# Patient Record
Sex: Female | Born: 1980 | Race: Black or African American | Hispanic: No | Marital: Single | Smoking: Former smoker
Health system: Southern US, Community
[De-identification: ages and names within clinical notes are randomized; demographics above are authoritative.]

## PROBLEM LIST (undated history)

## (undated) ENCOUNTER — Emergency Department (HOSPITAL_COMMUNITY): Admission: EM | Payer: No Typology Code available for payment source | Source: Home / Self Care

## (undated) DIAGNOSIS — IMO0002 Reserved for concepts with insufficient information to code with codable children: Secondary | ICD-10-CM

## (undated) DIAGNOSIS — M329 Systemic lupus erythematosus, unspecified: Secondary | ICD-10-CM

## (undated) HISTORY — PX: TUBAL LIGATION: SHX77

---
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
## 1999-01-01 ENCOUNTER — Encounter: Admission: RE | Admit: 1999-01-01 | Discharge: 1999-01-01 | Payer: Self-pay | Admitting: Family Medicine

## 1999-01-18 ENCOUNTER — Encounter: Admission: RE | Admit: 1999-01-18 | Discharge: 1999-01-18 | Payer: Self-pay | Admitting: Family Medicine

## 1999-01-25 ENCOUNTER — Encounter: Admission: RE | Admit: 1999-01-25 | Discharge: 1999-01-25 | Payer: Self-pay | Admitting: Family Medicine

## 1999-01-26 ENCOUNTER — Encounter: Admission: RE | Admit: 1999-01-26 | Discharge: 1999-01-26 | Payer: Self-pay | Admitting: Sports Medicine

## 1999-01-27 ENCOUNTER — Encounter: Admission: RE | Admit: 1999-01-27 | Discharge: 1999-01-27 | Payer: Self-pay | Admitting: Family Medicine

## 1999-03-31 ENCOUNTER — Emergency Department (HOSPITAL_COMMUNITY): Admission: EM | Admit: 1999-03-31 | Discharge: 1999-03-31 | Payer: Self-pay | Admitting: Emergency Medicine

## 1999-04-02 ENCOUNTER — Encounter: Admission: RE | Admit: 1999-04-02 | Discharge: 1999-04-02 | Payer: Self-pay | Admitting: Family Medicine

## 1999-04-06 ENCOUNTER — Encounter: Admission: RE | Admit: 1999-04-06 | Discharge: 1999-04-06 | Payer: Self-pay | Admitting: Family Medicine

## 1999-04-15 ENCOUNTER — Encounter: Admission: RE | Admit: 1999-04-15 | Discharge: 1999-04-15 | Payer: Self-pay | Admitting: Family Medicine

## 1999-04-23 ENCOUNTER — Encounter: Admission: RE | Admit: 1999-04-23 | Discharge: 1999-04-23 | Payer: Self-pay | Admitting: Family Medicine

## 1999-04-27 ENCOUNTER — Encounter: Admission: RE | Admit: 1999-04-27 | Discharge: 1999-04-27 | Payer: Self-pay | Admitting: Sports Medicine

## 1999-05-12 ENCOUNTER — Encounter: Admission: RE | Admit: 1999-05-12 | Discharge: 1999-05-12 | Payer: Self-pay | Admitting: Family Medicine

## 1999-07-28 ENCOUNTER — Encounter: Admission: RE | Admit: 1999-07-28 | Discharge: 1999-07-28 | Payer: Self-pay | Admitting: Family Medicine

## 1999-10-07 ENCOUNTER — Emergency Department (HOSPITAL_COMMUNITY): Admission: EM | Admit: 1999-10-07 | Discharge: 1999-10-07 | Payer: Self-pay | Admitting: Emergency Medicine

## 1999-11-23 ENCOUNTER — Encounter: Admission: RE | Admit: 1999-11-23 | Discharge: 1999-11-23 | Payer: Self-pay | Admitting: Family Medicine

## 2001-06-14 ENCOUNTER — Emergency Department (HOSPITAL_COMMUNITY): Admission: EM | Admit: 2001-06-14 | Discharge: 2001-06-14 | Payer: Self-pay | Admitting: Emergency Medicine

## 2001-06-14 ENCOUNTER — Encounter: Payer: Self-pay | Admitting: Emergency Medicine

## 2002-05-15 ENCOUNTER — Inpatient Hospital Stay (HOSPITAL_COMMUNITY): Admission: AD | Admit: 2002-05-15 | Discharge: 2002-05-15 | Payer: Self-pay | Admitting: Obstetrics and Gynecology

## 2002-05-19 ENCOUNTER — Inpatient Hospital Stay (HOSPITAL_COMMUNITY): Admission: AD | Admit: 2002-05-19 | Discharge: 2002-05-19 | Payer: Self-pay | Admitting: Obstetrics and Gynecology

## 2002-09-18 ENCOUNTER — Emergency Department (HOSPITAL_COMMUNITY): Admission: EM | Admit: 2002-09-18 | Discharge: 2002-09-18 | Payer: Self-pay | Admitting: Emergency Medicine

## 2003-12-30 ENCOUNTER — Inpatient Hospital Stay (HOSPITAL_COMMUNITY): Admission: AD | Admit: 2003-12-30 | Discharge: 2003-12-30 | Payer: Self-pay | Admitting: *Deleted

## 2004-01-01 ENCOUNTER — Inpatient Hospital Stay (HOSPITAL_COMMUNITY): Admission: AD | Admit: 2004-01-01 | Discharge: 2004-01-01 | Payer: Self-pay | Admitting: Obstetrics & Gynecology

## 2005-02-28 ENCOUNTER — Ambulatory Visit: Payer: Self-pay | Admitting: Internal Medicine

## 2005-03-31 ENCOUNTER — Ambulatory Visit: Payer: Self-pay | Admitting: Internal Medicine

## 2005-03-31 ENCOUNTER — Encounter: Payer: Self-pay | Admitting: Internal Medicine

## 2005-04-29 ENCOUNTER — Emergency Department (HOSPITAL_COMMUNITY): Admission: EM | Admit: 2005-04-29 | Discharge: 2005-04-29 | Payer: Self-pay | Admitting: Family Medicine

## 2005-05-03 ENCOUNTER — Inpatient Hospital Stay (HOSPITAL_COMMUNITY): Admission: AD | Admit: 2005-05-03 | Discharge: 2005-05-04 | Payer: Self-pay | Admitting: *Deleted

## 2005-05-11 ENCOUNTER — Inpatient Hospital Stay (HOSPITAL_COMMUNITY): Admission: RE | Admit: 2005-05-11 | Discharge: 2005-05-11 | Payer: Self-pay | Admitting: *Deleted

## 2005-07-13 ENCOUNTER — Inpatient Hospital Stay (HOSPITAL_COMMUNITY): Admission: AD | Admit: 2005-07-13 | Discharge: 2005-07-13 | Payer: Self-pay | Admitting: Obstetrics & Gynecology

## 2005-07-21 ENCOUNTER — Ambulatory Visit: Payer: Self-pay | Admitting: *Deleted

## 2005-07-22 ENCOUNTER — Inpatient Hospital Stay (HOSPITAL_COMMUNITY): Admission: AD | Admit: 2005-07-22 | Discharge: 2005-07-24 | Payer: Self-pay | Admitting: *Deleted

## 2005-07-22 ENCOUNTER — Ambulatory Visit: Payer: Self-pay | Admitting: Family Medicine

## 2005-07-22 ENCOUNTER — Ambulatory Visit: Payer: Self-pay | Admitting: *Deleted

## 2005-07-28 ENCOUNTER — Ambulatory Visit: Payer: Self-pay | Admitting: Gynecology

## 2005-08-11 ENCOUNTER — Ambulatory Visit (HOSPITAL_COMMUNITY): Admission: RE | Admit: 2005-08-11 | Discharge: 2005-08-11 | Payer: Self-pay | Admitting: *Deleted

## 2005-08-25 ENCOUNTER — Ambulatory Visit: Payer: Self-pay | Admitting: Gynecology

## 2005-09-15 ENCOUNTER — Ambulatory Visit (HOSPITAL_COMMUNITY): Admission: RE | Admit: 2005-09-15 | Discharge: 2005-09-15 | Payer: Self-pay | Admitting: Obstetrics & Gynecology

## 2005-09-15 ENCOUNTER — Ambulatory Visit: Payer: Self-pay | Admitting: Gynecology

## 2005-10-06 ENCOUNTER — Ambulatory Visit: Payer: Self-pay | Admitting: Gynecology

## 2005-10-20 ENCOUNTER — Ambulatory Visit: Payer: Self-pay | Admitting: Gynecology

## 2005-10-21 ENCOUNTER — Ambulatory Visit: Payer: Self-pay | Admitting: Obstetrics & Gynecology

## 2005-10-24 ENCOUNTER — Ambulatory Visit: Payer: Self-pay | Admitting: Gynecology

## 2005-10-27 ENCOUNTER — Inpatient Hospital Stay (HOSPITAL_COMMUNITY): Admission: AD | Admit: 2005-10-27 | Discharge: 2005-11-01 | Payer: Self-pay | Admitting: Gynecology

## 2005-10-27 ENCOUNTER — Ambulatory Visit: Payer: Self-pay | Admitting: Obstetrics and Gynecology

## 2005-10-27 ENCOUNTER — Ambulatory Visit: Payer: Self-pay | Admitting: Neonatology

## 2005-10-29 ENCOUNTER — Encounter (INDEPENDENT_AMBULATORY_CARE_PROVIDER_SITE_OTHER): Payer: Self-pay | Admitting: Specialist

## 2005-11-05 ENCOUNTER — Ambulatory Visit: Payer: Self-pay | Admitting: Certified Nurse Midwife

## 2005-11-05 ENCOUNTER — Inpatient Hospital Stay (HOSPITAL_COMMUNITY): Admission: AD | Admit: 2005-11-05 | Discharge: 2005-11-05 | Payer: Self-pay | Admitting: Gynecology

## 2006-06-02 ENCOUNTER — Encounter (INDEPENDENT_AMBULATORY_CARE_PROVIDER_SITE_OTHER): Payer: Self-pay | Admitting: *Deleted

## 2006-08-13 ENCOUNTER — Inpatient Hospital Stay (HOSPITAL_COMMUNITY): Admission: AD | Admit: 2006-08-13 | Discharge: 2006-08-13 | Payer: Self-pay | Admitting: Obstetrics

## 2006-08-24 ENCOUNTER — Ambulatory Visit (HOSPITAL_COMMUNITY): Admission: RE | Admit: 2006-08-24 | Discharge: 2006-08-24 | Payer: Self-pay | Admitting: Obstetrics

## 2006-10-31 ENCOUNTER — Ambulatory Visit (HOSPITAL_COMMUNITY): Admission: RE | Admit: 2006-10-31 | Discharge: 2006-10-31 | Payer: Self-pay | Admitting: Obstetrics

## 2006-11-27 ENCOUNTER — Inpatient Hospital Stay (HOSPITAL_COMMUNITY): Admission: AD | Admit: 2006-11-27 | Discharge: 2006-12-01 | Payer: Self-pay | Admitting: Obstetrics & Gynecology

## 2007-02-12 ENCOUNTER — Inpatient Hospital Stay (HOSPITAL_COMMUNITY): Admission: AD | Admit: 2007-02-12 | Discharge: 2007-02-15 | Payer: Self-pay | Admitting: Obstetrics

## 2007-03-09 ENCOUNTER — Inpatient Hospital Stay (HOSPITAL_COMMUNITY): Admission: AD | Admit: 2007-03-09 | Discharge: 2007-03-09 | Payer: Self-pay | Admitting: Obstetrics & Gynecology

## 2007-03-16 ENCOUNTER — Inpatient Hospital Stay (HOSPITAL_COMMUNITY): Admission: AD | Admit: 2007-03-16 | Discharge: 2007-03-16 | Payer: Self-pay | Admitting: Obstetrics

## 2007-03-22 ENCOUNTER — Encounter: Payer: Self-pay | Admitting: Obstetrics

## 2007-03-22 ENCOUNTER — Inpatient Hospital Stay (HOSPITAL_COMMUNITY): Admission: RE | Admit: 2007-03-22 | Discharge: 2007-03-25 | Payer: Self-pay | Admitting: Obstetrics

## 2007-05-02 ENCOUNTER — Emergency Department (HOSPITAL_COMMUNITY): Admission: EM | Admit: 2007-05-02 | Discharge: 2007-05-02 | Payer: Self-pay | Admitting: Family Medicine

## 2008-04-24 IMAGING — US US OB FOLLOW-UP
1 series · 13 of 28 positions shown · non-contrast
Comparison: none

CLINICAL DATA: Follow-up fetal anatomy and assess growth.

[Series 1: us ob follow-up · 0.35mm/px · 13 of 65 slices shown]
[im 3/65]
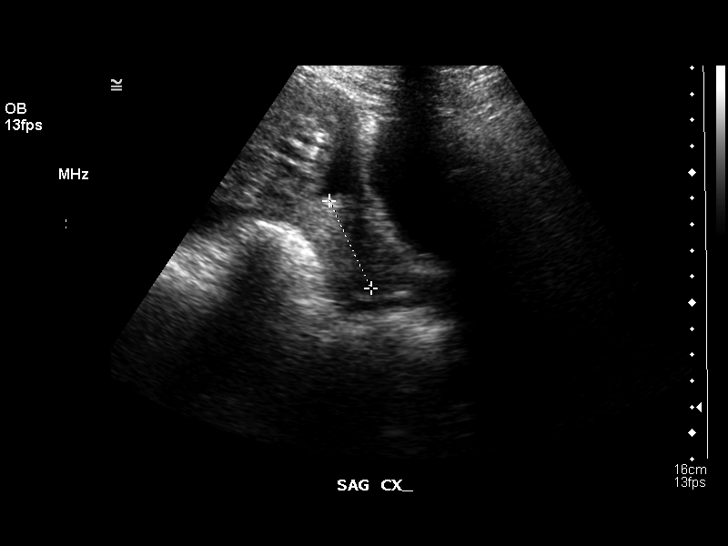
[im 8/65]
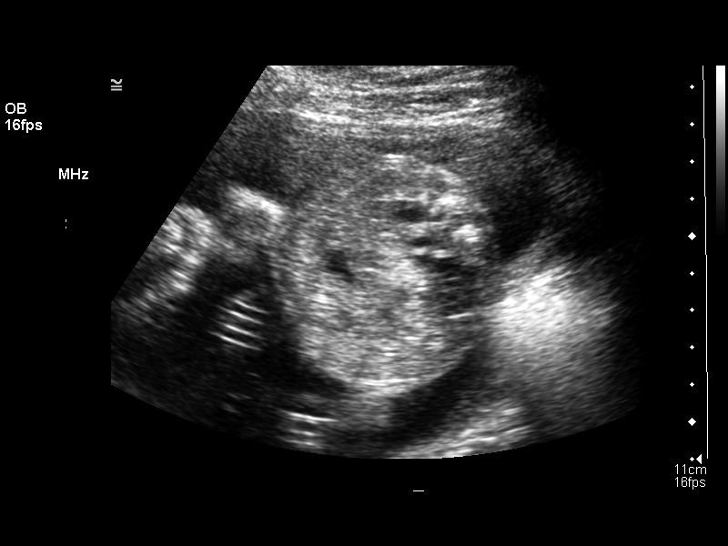
[im 12/65]
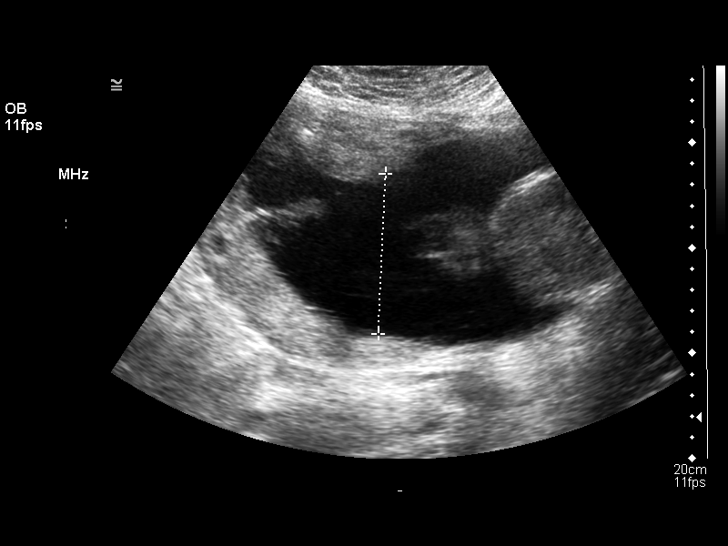
[im 17/65]
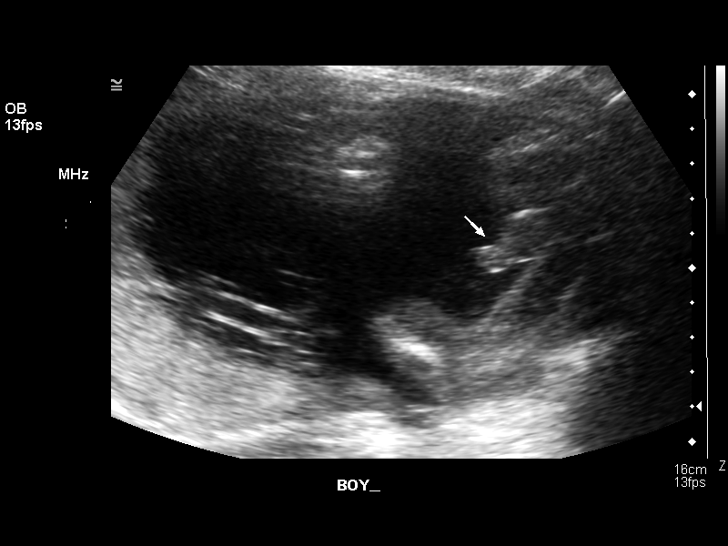
[im 22/65]
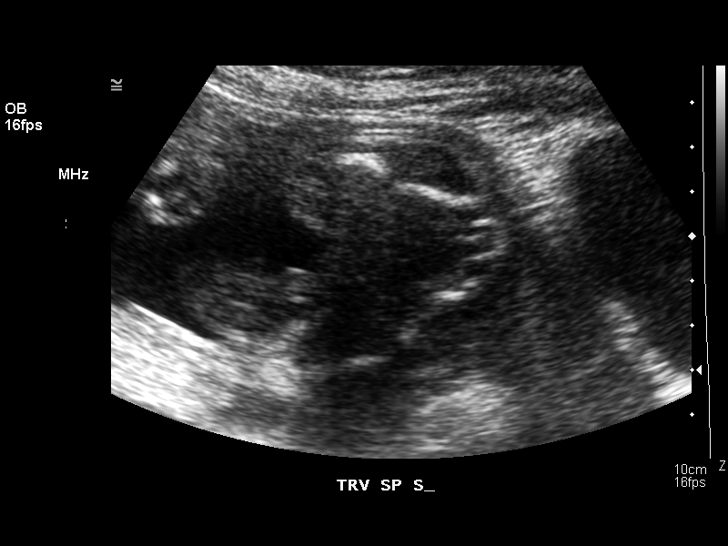
[im 27/65]
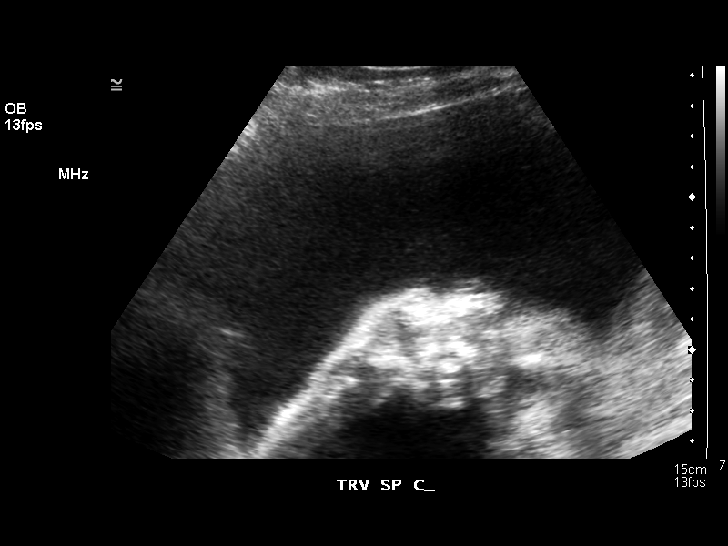
[im 34/65]
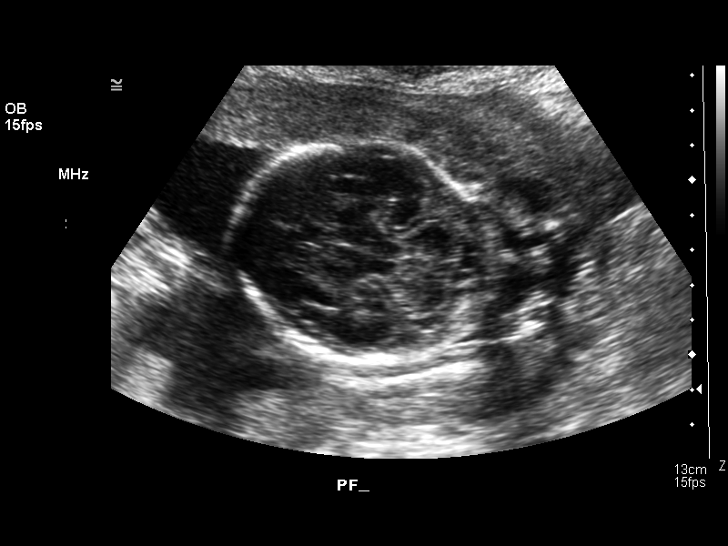
[im 38/65]
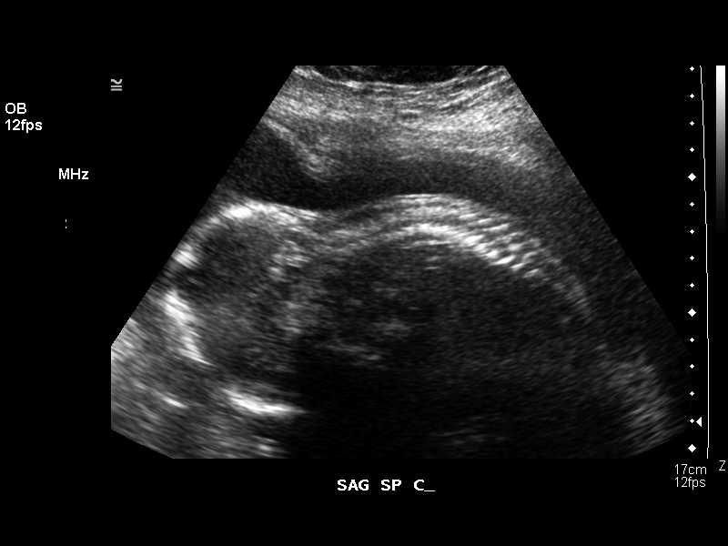
[im 43/65]
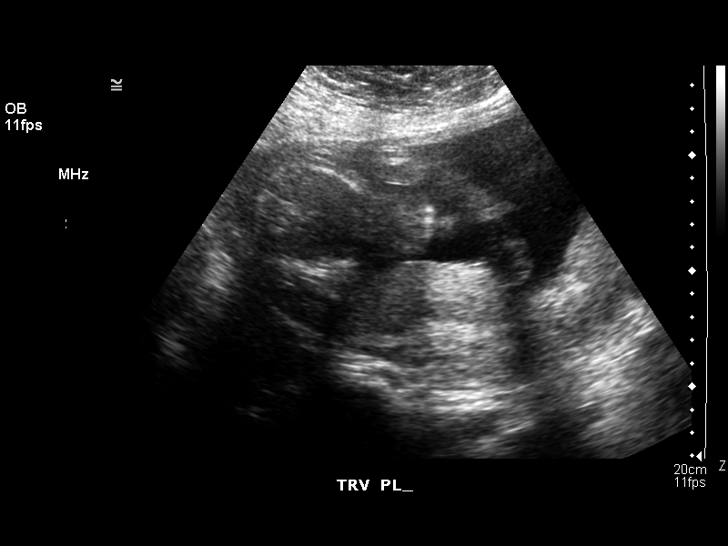
[im 48/65]
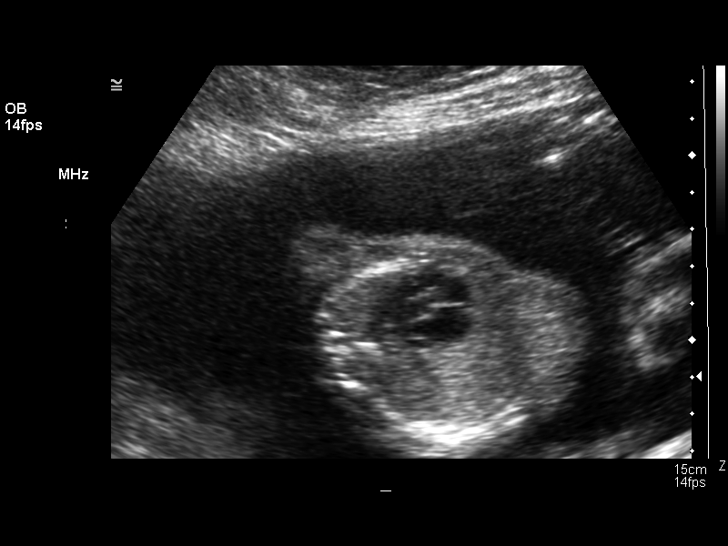
[im 53/65]
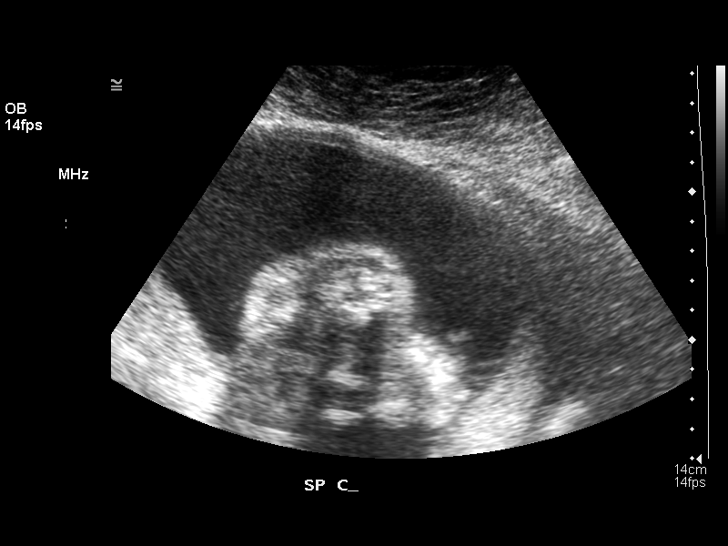
[im 57/65]
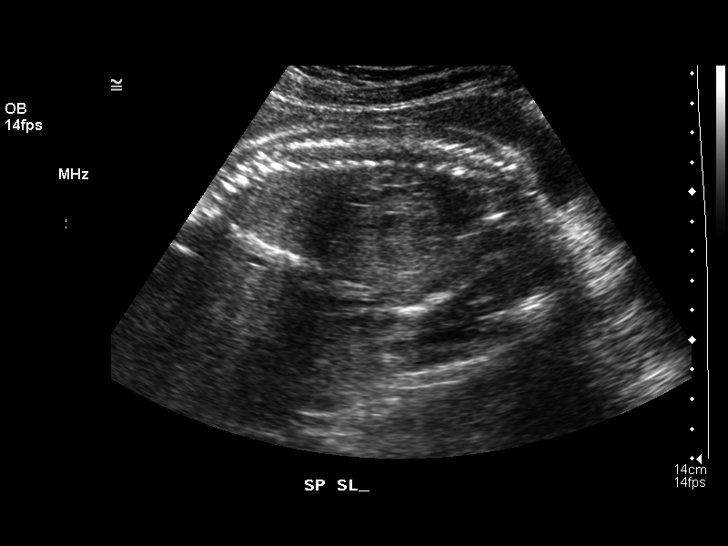
[im 62/65]
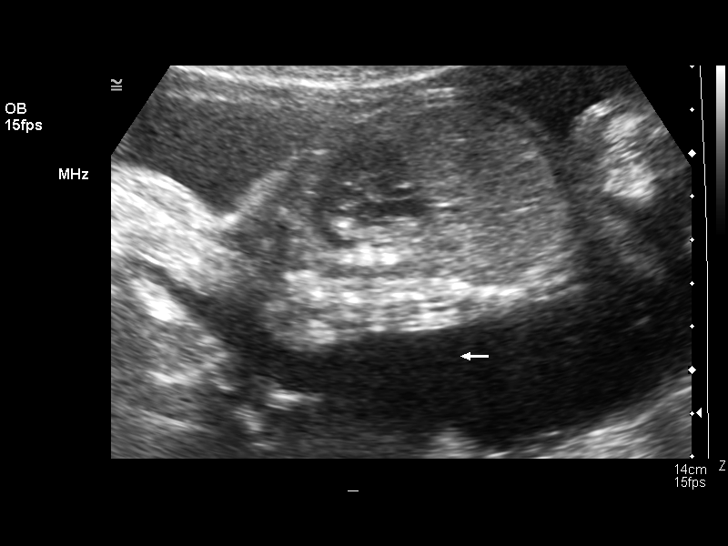

[13 of 28 positions shown; findings below may reference images not displayed]

OBSTETRICAL ULTRASOUND RE-EVALUATION:
 Number of Fetuses: 1
 Heart Rate:  141
 Movement:  Yes
 Breathing:  No
 Presentation:  Breech
 Placental Location:  Posterior
 Grade:  I
 Previa:  No
 Amniotic Fluid (subjective):  Normal
 Amniotic Fluid (objective):  7.6 cm Vertical pocket 

 FETAL BIOMETRY
 BPD:  6.3 cm  25 w 4 d
 HC:  22.0 cm   24 w 6 d
 AC:  19.6 cm   24 w 2 d
 FL:   4.4 cm   24 w 4 d 

 Mean GA:  24 w 6 d  US EDC:  12/30/05
 Assigned GA:  24 w 6 d  Assigned EDC:  12/30/05
 Fetal indices are within normal limits.  
 EFW: 701 g (H)

 FETAL ANATOMY
 Lateral Ventricles:  Visualized 
 Thalami/CSP:  Visualized 
 Posterior Fossa:  Visualized 
 Nuchal Region:  Previously seen 
 Spine:  Visualized 
 4 Chamber Heart on Left:  Visualized 
 Stomach on Left:  Visualized 
 3 Vessel Cord:  Previously seen 
 Cord Insertion Site:  Visualized 
 Kidneys:  Visualized 
 Bladder:  Visualized 
 Extremities:  Previously seen 

 ADDITIONAL ANATOMY VISUALIZED:  RVOT, diaphragm, heel, 5th digit, ductal arch, and aortic arch. 
 Comments:  Noted is the presence of mild bilateral renal fetal pyelectasis today measuring 5.5 mm on the left and 3.8 mm on the right.  Follow-up at greater than 33 weeks would be recommended for reassessment.  A nasal bone could not be clearly identified on profile views.

 Evaluation limited by: Fetal position.

 MATERNAL UTERINE AND ADNEXAL FINDINGS
 Cervix: 3.7 cm Transabdominally
IMPRESSION: 1.  Single intrauterine pregnancy demonstrating an estimated gestational age by ultrasound of 24 weeks and 6 days.  Correlation with assigned gestational age by initial ultrasound of 24 weeks and 6 days correlates with appropriate growth.
 2.  An improved anatomic assessment was possible today.  The fetal spine, RVOT, 5th digit, and cardiac arches were all seen and have a normal appearance.  On today’s exam review of the fetal profile did not definitely delineate the nasal bone.  Review of images from 08/11/05 do not clearly demonstrate visualization of a nasal bone on profile images suggesting that the nasal bone is absent.  This carries an increased risk for Down syndrome of 8x the patient’s baseline risk.  Also noted is the presence of mild bilateral renal fetal pyelectasis.  This was not present on the previous exam with the right kidney demonstrating an AP width of 5.5 mm.  Follow-up for this finding at greater than 33 weeks would be recommended for reassessment.  
 3.  Subjectively and quantitatively normal amniotic fluid volume and normal cervical length.  
 4.  The patient was sent with a preliminary copy of today’s results to an office visit immediately following this exam.

## 2009-08-20 ENCOUNTER — Emergency Department (HOSPITAL_COMMUNITY): Admission: EM | Admit: 2009-08-20 | Discharge: 2009-08-20 | Payer: Self-pay | Admitting: Family Medicine

## 2009-09-22 IMAGING — US US RENAL
1 series · 14 of 25 positions shown · non-contrast
Comparison: none

CLINICAL DATA: Pyelonephritis.  
 RENAL/URINARY TRACT ULTRASOUND:
TECHNIQUE: Complete ultrasound examination of the urinary tract was performed including evaluation of the kidneys, renal collecting systems, and urinary bladder.

[Series 1: us renal · 0.30mm/px · 14 of 34 slices shown]
[im 1/34]
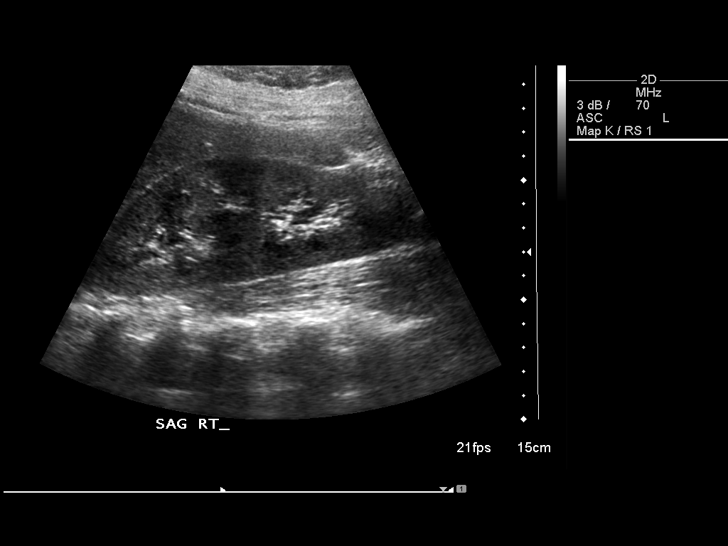
[im 3/34]
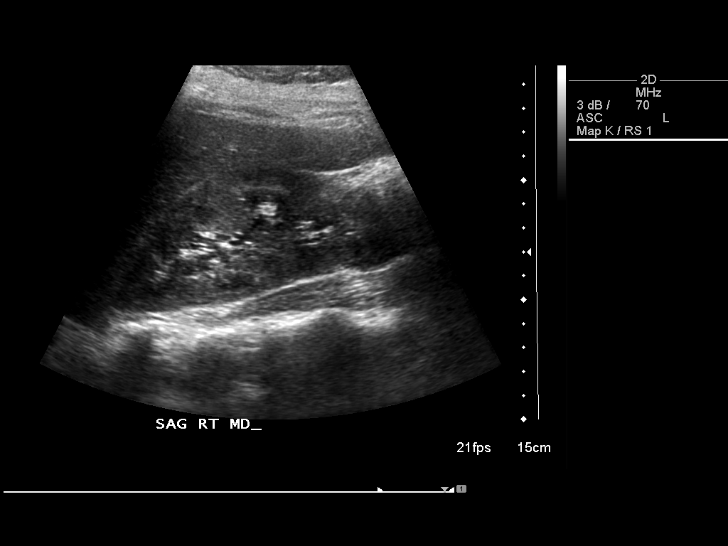
[im 6/34]
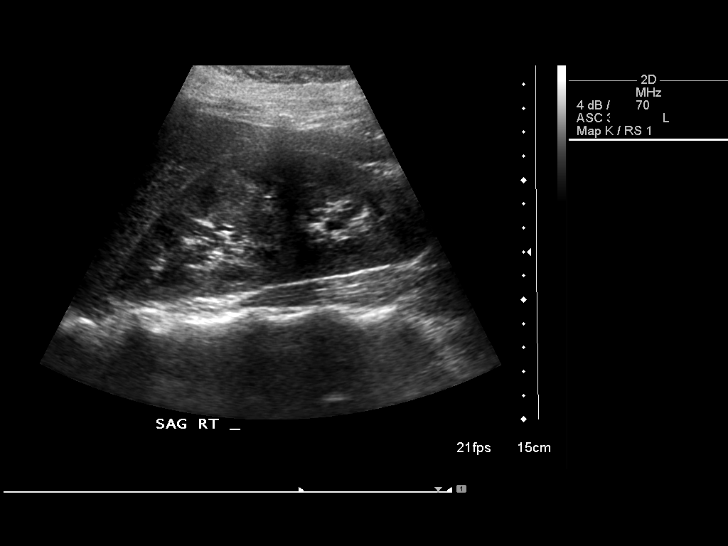
[im 9/34]
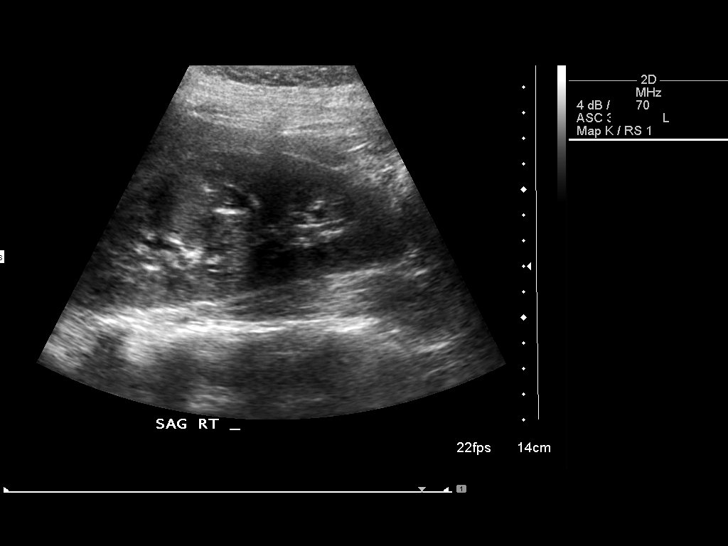
[im 12/34]
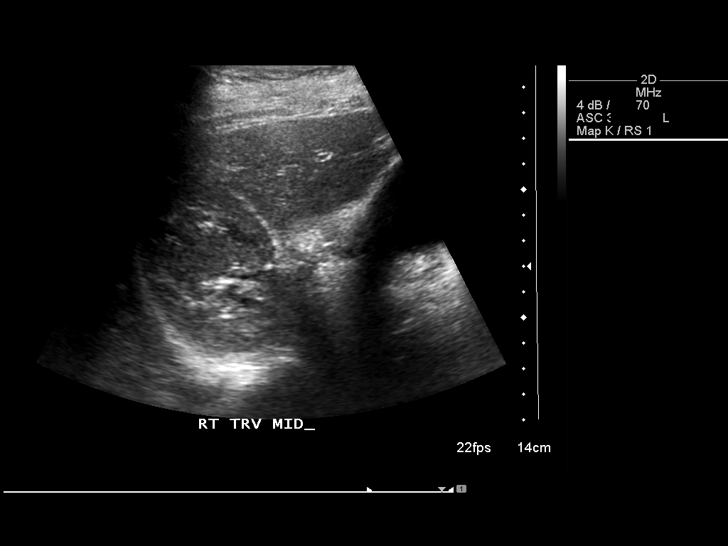
[im 13/34]
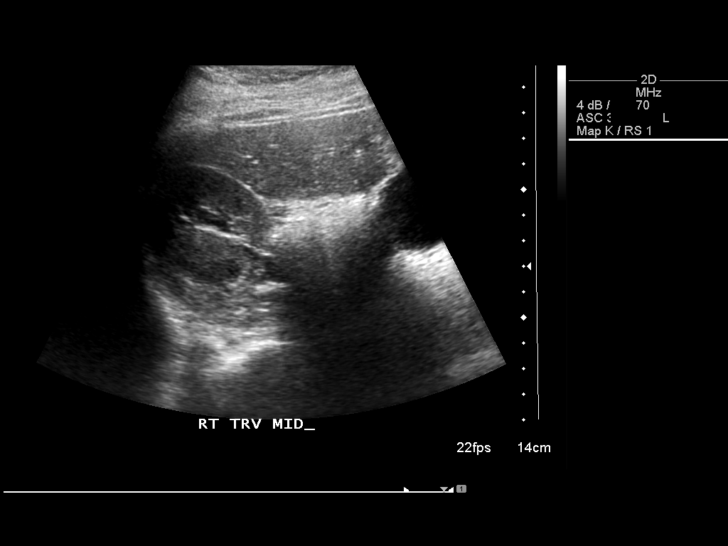
[im 16/34]
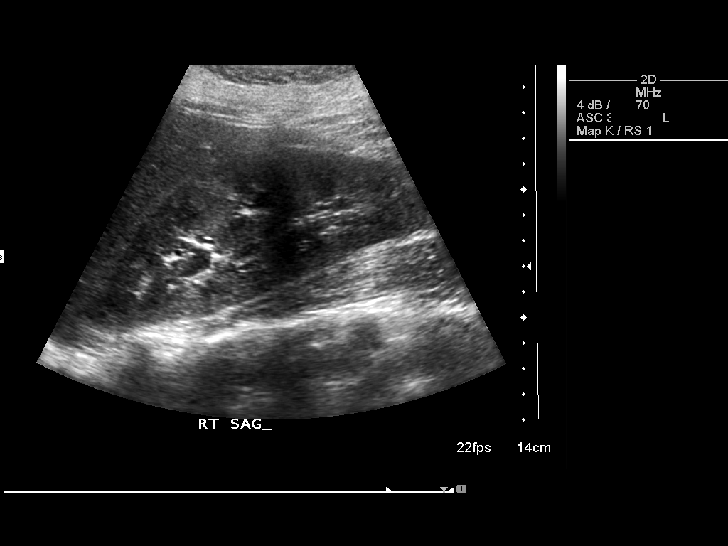
[im 18/34]
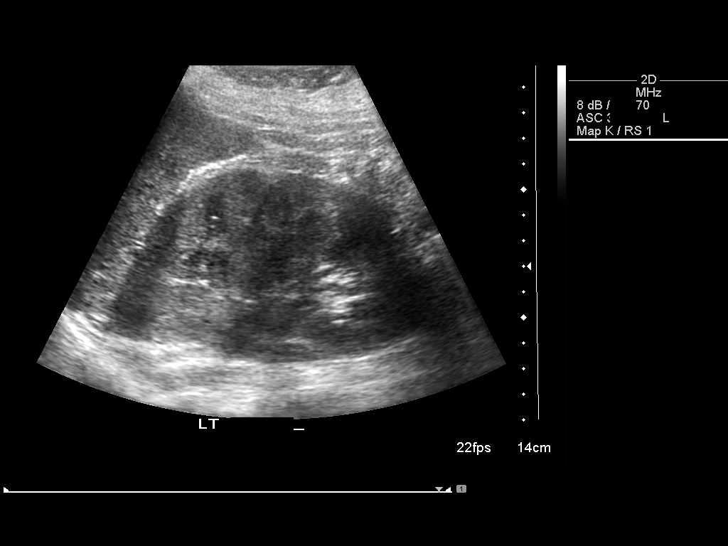
[im 21/34]
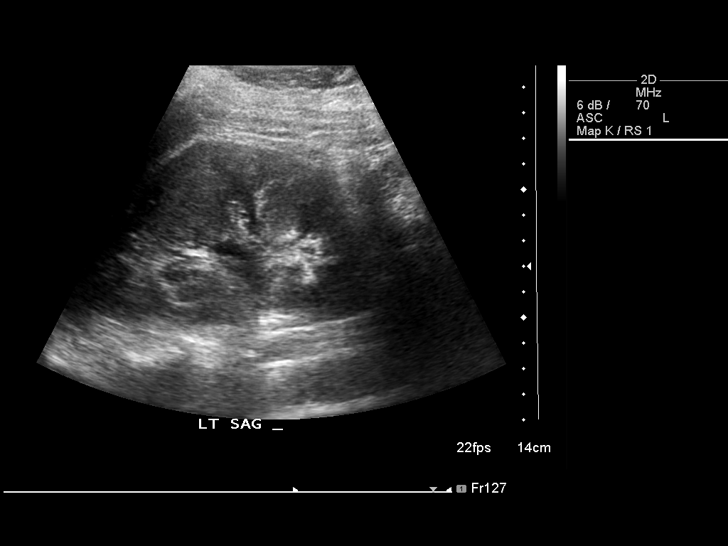
[im 23/34]
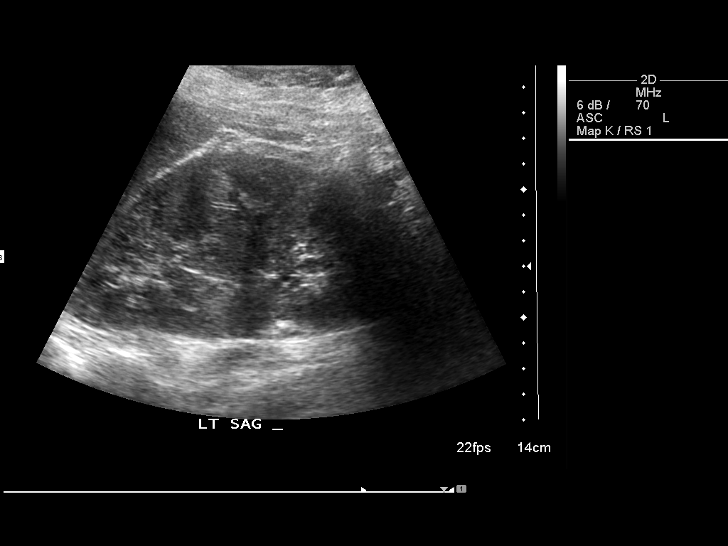
[im 25/34]
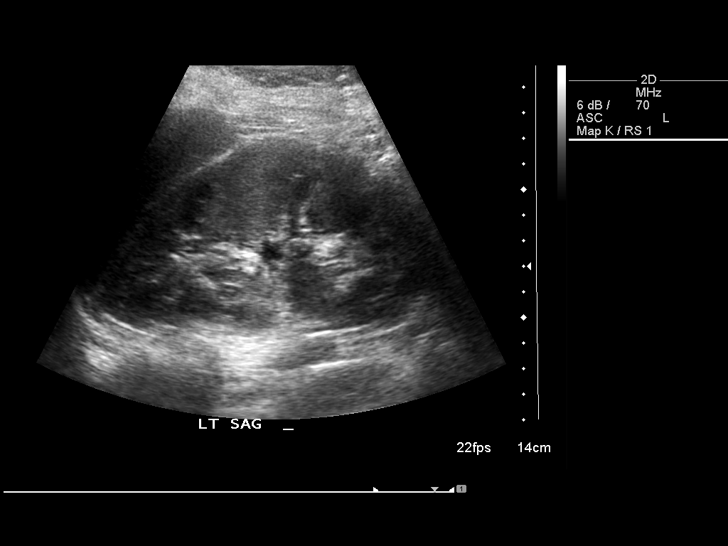
[im 28/34]
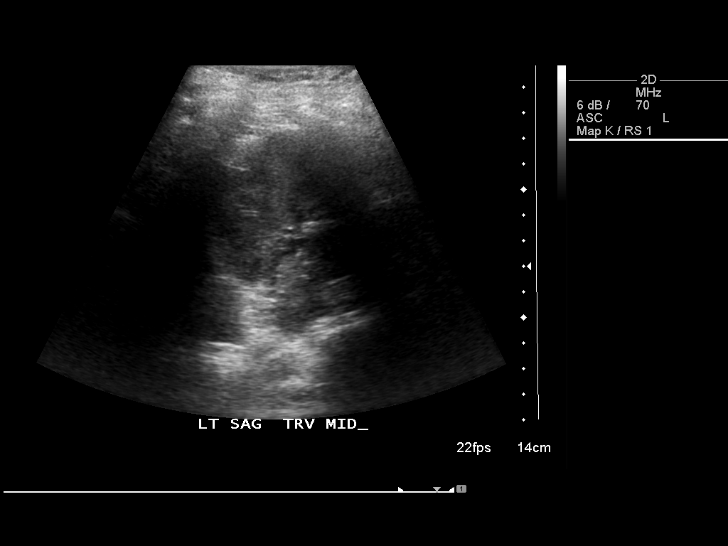
[im 31/34]
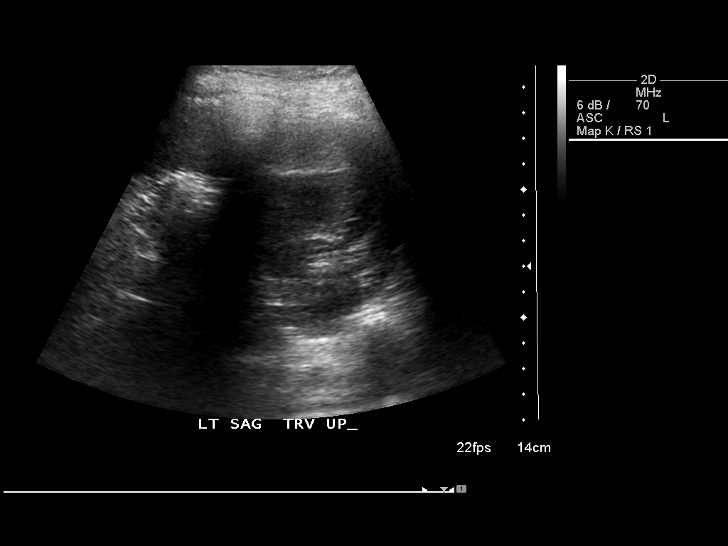
[im 34/34]
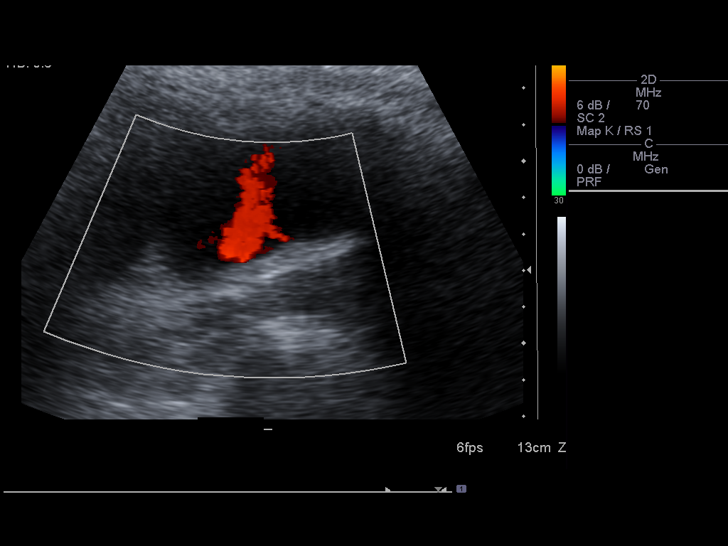

[14 of 25 positions shown; findings below may reference images not displayed]

FINDINGS: Both kidneys have a normal size, shape, and echotexture.  The right kidney measures 14.2 cm and the left kidney measures 14.1 cm in length.  No hydronephrosis, shadowing renal calculi, or masses are identified.  The corticomedullary echotexture is symmetric and normal bilaterally.  Bilateral ureteral jets are noted.
IMPRESSION: Normal renal ultrasound.

## 2010-04-25 ENCOUNTER — Encounter: Payer: Self-pay | Admitting: *Deleted

## 2010-06-21 LAB — URINE CULTURE
Colony Count: NO GROWTH
Culture: NO GROWTH

## 2010-06-21 LAB — POCT URINALYSIS DIP (DEVICE)
Bilirubin Urine: NEGATIVE
Glucose, UA: NEGATIVE mg/dL
Ketones, ur: NEGATIVE mg/dL
Nitrite: POSITIVE — AB
Protein, ur: NEGATIVE mg/dL
Specific Gravity, Urine: 1.02 (ref 1.005–1.030)
Urobilinogen, UA: 0.2 mg/dL (ref 0.0–1.0)
pH: 6.5 (ref 5.0–8.0)

## 2010-06-21 LAB — POCT PREGNANCY, URINE: Preg Test, Ur: NEGATIVE

## 2010-08-17 NOTE — Op Note (Signed)
[name], [name]             ACCOUNT NO.:  [id number]   MEDICAL RECORD NO.:  [id number]          PATIENT TYPE:  INP   LOCATION:  9144                          [facility]ILITY:  [facility]   PHYSICIAN:  Charles A. Clearance Coots, M.D.DATE OF BIRTH:  September 08, 1980   DATE OF PROCEDURE:  03/22/2007  DATE OF DISCHARGE:                               OPERATIVE REPORT   PREOPERATIVE DIAGNOSES:  1. Term pregnancy.  2. Previous cesarean section x2, desired repeat cesarean section.  3. Desired permanent sterilization.   POSTOPERATIVE DIAGNOSES:  1. Term pregnancy.  2. Previous cesarean section x2, desired repeat cesarean section.  3. Desired permanent sterilization.   OPERATION/PROCEDURE:  1. Repeat low transverse cesarean section.  2. Bilateral partial salpingectomy.   SURGEON:  Charles A. Clearance Coots, M.D.   ASSISTANT:  Roseanna Rainbow, M.D.   ANESTHESIA:  Spinal.   ESTIMATED BLOOD LOSS:  700 mL   IV FLUIDS:  2700 mL   URINE OUTPUT:  800 mL clear.   COMPLICATIONS:  None.   DRAINS:  Foley to gravity.   FINDINGS:  Vdiable female at 90.  Apgars 9 at one minute and 9 at five  minutes.  Weight of 6 pounds 10 ounces.  Normal uterus, ovaries and  fallopian tubes.   SPECIMEN:  Approximately 2 cm segments of right and left fallopian  tubes,  placenta. Disposition of specimens to pathology.   DESCRIPTION OF PROCEDURE:  The patient was brought to operating room.  After satisfactory spinal anesthesia, the abdomen was prepped and draped  in usual sterile fashion.  Pfannenstiel skin incision was made with a  scalpel that was deepened down to the fascia with the scalpel through  the previous incision.  Fascia was nicked in the midline and the fascial  incision was extended to the left and to the right with curved Mayo  scissors.  The superior inferior fascial edges were taken off the rectus  muscles with both blunt and sharp dissection.  Rectus muscles bluntly  and sharply divided in the midline and the  peritoneum was entered  digitally and was digitally extended to the left and to the right. The  bladder blade was positioned and the vesicouterine fold of peritoneum  above the reflection of the urinary bladder was grasped with forceps and  was incised and undermined with Metzenbaum scissors.  The incision was  extended to the left and to the right with Metzenbaum scissors.  The  bladder flap was bluntly developed and the bladder blade was  repositioned from the urinary bladder, placing it well out of the  operative field.  The uterus was then entered transversely in the lower  uterine segment with a scalpel.  Clear amniotic fluid was expelled.  Uterine incision was extended to left and to the right digitally.  The  vertex was noted to be hyperextended to the left occiput posterior  position.  The occiput was rotated to the anterior position, but could  not adequately be flexed into the incision for delivery.  The mushroom  Mitey Vac was then applied to the occiput and the occiput was flexed  into the incision  and the delivery was accomplished with the aid of  fundal pressure from the assistant with one pull of the vacuum.  The  infant's mouth and nose were suctioned with a suction bulb and the  delivery was completed with the aid of fundal pressure from the  assistant.  The umbilical cord was doubly clamped and cut and infant was  handed off to the nursery staff.  The placenta was then manually removed  from the uterus intact. The endometrial surface was thoroughly debrided  with a dry lap sponge.  The edges of the uterine incision were grasped  with ring forceps.  The uterus was closed with a continuous interlocking  suture of 0 Monocryl from each corner to the center.  Hemostasis was  excellent.   Attention was then turned to the tubal ligation procedure.  The left  fallopian tube was grasped with a Babcock clamp and was identified from  the cornual end to the fimbrial end and grasped  with Babcock clamps.  The knuckle of tube beneath the Babcock clamp in the isthmic area of the  tube was then suture ligated through the mesosalpinx and the section of  tube above the knot was excised with Metzenbaum scissors and submitted  to pathology for evaluation.  There was no active bleeding from the  tubal stump.  Therefore it was placed back in the normal anatomic  position.  The same procedure was performed on the opposite side without  complications.   The abdomen was then closed as follows.  Peritoneum was closed with  continuous suture of 2-0 Monocryl.  Fascia was closed with continuous  suture of 0 PDS from each corner to the center.  Subcutaneous tissue was  thoroughly irrigated with warm saline solution and all areas of  subcutaneous bleeding were coagulated with Bovie.  The skin was then  closed with continuous subcuticular suture of 3-0 Monocryl.  Sterile  bandage was applied to the incision at closure.  Surgical technician  indicated that all needle, sponge and instrument counts were correct x2.  The patient tolerated procedure well and was transported to recovery in  satisfactory condition.      Charles A. Clearance Coots, M.D.  Electronically Signed     CAH/MEDQ  D:  03/22/2007  T:  03/23/2007  Job:  [id number]

## 2010-08-17 NOTE — Discharge Summary (Signed)
[name], [name]             ACCOUNT NO.:  [id number]   MEDICAL RECORD NO.:  [id number]          PATIENT TYPE:  INP   LOCATION:  9156                          [facility]ILITY[facility]   PHYSICIAN:  Charles A. Clearance Coots, M.D.DATE OF BIRTH:  1981-01-03   DATE OF ADMISSION:  02/12/2007  DATE OF DISCHARGE:  02/15/2007                               DISCHARGE SUMMARY   ADMITTING DIAGNOSES:  1. Thirty-three weeks' gestation.  2. Discoid lupus.  3. Recurrent urinary tract infections, possible pyelonephritis.   DISCHARGE DIAGNOSIS:  1. Thirty-three weeks' gestation.  2. Discoid lupus.  3. Recurrent urinary tract infections.  4. Possible pyelonephritis.  5. Ascending urinary tract infection, much improved after IV      antibiotic therapy.  6. Discharged home undelivered at 71 weeks' gestation in good      condition, with urinary tract infection resolved.   REASON FOR ADMISSION:  A 30 year old G4, P-1-1-1-2, estimated date of  confinement of March 31, 2007, seen in the office with severe back  pain, denied fever, chills, nausea and vomiting.  The patient has a  history of a urinary tract infection that was treated but was seen in  the office today with positive nitrites in the urine.  The treatment of  the current urinary tract infection was sensitive to the antibiotic that  was given, which was greater than 100,000 E-coli bacterial that had  grown out.  The patient states that she had a similar exacerbation of  pain and recurrent urinary tract infections with her last pregnancy and  was delivered prematurely.  The patient has a past medical history of  discoid lupus and is currently being followed by Dr. Kellie Simmering,  rheumatology.   PAST MEDICAL HISTORY:  Surgery:  Cesarean section in 1999 and 2007.  Illnesses:  Discoid lupus diagnosed in 2007.   MEDICATIONS:  Prenatal vitamins.   ALLERGIES:  NO KNOWN DRUG ALLERGIES.   PHYSICAL EXAM:  GENERAL:  Well-nourished, well-developed female, in  no  acute distress.  VITAL SIGNS:  Afebrile, vital signs are stable.  LUNGS:  Clear to auscultation bilaterally.  HEART:  Regular rate and rhythm.  BACK:  Positive for right CVA tenderness.  CERVIX:  Long, closed and vertex was high.   ADMITTING LABORATORY VALUES:  Hemoglobin 10, hematocrit 33, white blood  cell count 9,300, platelets 251,000.  Comprehensive metabolic panel was  within normal limits.   HOSPITAL COURSE:  The patient was admitted and started on IV Rocephin,  responded well to therapy, and by hospital day #2 was having no CVA  tenderness.  She continued to do well and was discharged home on  hospital day #3 in good condition, undelivered at 31 weeks' gestation.   DISCHARGE LABORATORY VALUES:  A lupus panel was drawn, and the results  were not available at the time of discharge.   DISCHARGE DISPOSITION:  Medications:  Continue prenatal vitamins.  Routine written instructions were given for discharge, undelivered with  pre-term labor precautions.  The patient is to call the office for  follow-up appointment in 1 week.  She also is to call her rheumatologist  to  schedule an appointment for followup.      Charles A. Clearance Coots, M.D.  Electronically Signed     CAH/MEDQ  D:  03/09/2007  T:  03/10/2007  Job:  [id number]

## 2010-08-17 NOTE — Discharge Summary (Signed)
[name], [name]             ACCOUNT NO.:  [id number]   MEDICAL RECORD NO.:  [id number]          PATIENT TYPE:  INP   LOCATION:  9144                          [facility]   PHYSICIAN:  Charles A. Clearance Coots, M.D.DATE OF BIRTH:  May 26, 1980   DATE OF ADMISSION:  03/22/2007  DATE OF DISCHARGE:  03/25/2007                               DISCHARGE SUMMARY   ADMITTING DIAGNOSIS:  Term pregnancy, previous cesarean section x2,  desired repeat cesarean section, desired permanent sterilization.   DISCHARGE DIAGNOSIS:  Term pregnancy, previous cesarean section x2,  desired repeat cesarean section, desired permanent sterilization, status  post repeat low transverse cesarean section on March 22, 2007, viable  female was delivered at 0943, Apgars of 9 at one minute, 9 at five  minutes, weight of 6 pounds 10 ounces.  Mother and infant were  discharged home in good condition status post bilateral salpingectomy  during cesarean section.   REASON FOR ADMISSION:  A 30 year old G4, P2, estimated date of  confinement of March 31, 2007.  History of previous cesarean section  x2.  Desired repeat cesarean section.  Desired permanent sterilization.   PAST MEDICAL HISTORY:  Is significant for presumptive diagnosis of  discoid lupus with facial skin manifestations.   PAST MEDICAL HISTORY:  Surgery:  Cesarean section.  Illnesses:  Lupus, discoid skin type.   MEDICATIONS:  Prenatal vitamins.   ALLERGIES:  NO KNOWN DRUG ALLERGIES.   FAMILY HISTORY:  Significant for hypertension.   PHYSICAL EXAM:  Well-nourished, well-developed female in no acute  distress,  LUNGS:  Are clear to auscultation bilaterally.  HEART:  Regular rate and rhythm.  ABDOMEN:  Gravid, nontender.  CERVIX:  Is long and closed.   ADMITTING LABORATORY VALUES:  Hemoglobin 11, hematocrit 34, white blood  cell count 7700, platelets 202,000.  RPR is nonreactive.   HOSPITAL COURSE:  The patient underwent a repeat low transverse  cesarean  section and bilateral partial salpingectomy on March 22, 2007.  There  were no intraoperative complications.  Postoperative course was  uncomplicated.  The patient was discharged home on postop day #3 in good  condition.   DISCHARGE LABS:  Hemoglobin 9.8, hematocrit 30, white blood cell count  8100, platelets 168,000.   DISCHARGE DISPOSITION:  Medications:  Continue prenatal vitamins, Tylox  and ibuprofen were prescribed for pain.  Routine written instructions  were given for discharge after cesarean section.  The patient is to call  office for follow-up appointment in 2 weeks.      Charles A. Clearance Coots, M.D.  Electronically Signed     CAH/MEDQ  D:  04/19/2007  T:  04/19/2007  Job:  [id number]   cc:   Aundra Dubin, M.D.  [address]  [place]  [place] [address]

## 2010-08-20 NOTE — H&P (Signed)
[name], [name]             ACCOUNT NO.:  [id number]   MEDICAL RECORD NO.:  [id number]          PATIENT TYPE:  INP   LOCATION:                                [facility]   PHYSICIAN:  Conni Elliot, M.D.DATE OF BIRTH:  1980-09-05   DATE OF ADMISSION:  07/22/2005  DATE OF DISCHARGE:                                HISTORY & PHYSICAL   CHIEF COMPLAINT:  Pyelonephritis.   HISTORY OF PRESENT ILLNESS:  [name] is a 30 year old G3, P1, 0-1-1 at  17 weeks via five week ultrasound, who was diagnosed with a urinary tract  infection on July 13, 2005.  Culture at that time grew out greater than  100,000 colonies of E. coli.  No group B strep present and it was pan  sensitive.  She had been taking Macrobid 500 p.o. b.i.d. for seven days and  returned to the [facility] on April 19 with bilateral CVA  tenderness.  The patient denied any fever or dysuria.  She was admitted for  IV antibiotics for pyelonephritis.   PAST OB HISTORY:  1.  Significant for in 1996 a two month spontaneous abortion.  2.  In 1999, female baby born at 27 weeks via C-section secondary to pregnancy-      induced hypertension.  3.  In 2006, which is current pregnant, the patient was treated for      Trichomonas in February, 2007.   FAMILY HISTORY:  Significant for maternal grandmother with breast cancer,  paternal grandmother with hypertension, brother with schizophrenia, and a  maternal aunt who has discoid lupus.   PAST MEDICAL HISTORY:  Discoid lupus diagnosed in January, 2007.   MEDICATIONS:  1.  Prenatal vitamins.  2.  Diflucan 150 mg p.o. x3 days for a yeast infection secondary to current      antibiotic use.   ALLERGIES:  No known drug allergies.   PHYSICAL EXAMINATION:  VITAL SIGNS:  Temperature 97.8, pulse 87, blood  pressure 114/77, respirations 16, satting 100% on room air.  GENERAL:  The patient is in no acute distress.  CARDIAC:  Regular rate and rhythm.  LUNGS:  Clear to  auscultation bilaterally.  ABDOMEN:  Gravid with the fundus palpable at the umbilicus.  Patient did  have bilateral CVA tenderness.  SKIN:  Significant for a hyperpigmented lesion on the right cheek.   ASSESSMENT/PLAN:  This is a 30 year old G3, P1, 0-1-1 at 17 weeks with  pyelonephritis.  We will obtain a repeat in and out cath and send it for UA  as well as a urine Gram's stain and continue to follow her cultures.  We  will start her on ceftriaxone 1 gm IV q.12h.  We will continue antibiotics  until the patient's symptoms have resolved  and at that time, switch her over to a p.o. regimen.  She will more than  likely need prophylactic antibiotic treatment throughout the remainder of  her pregnancy.  We will also continue her Diflucan 150 mg p.o. q.h.s. x3  more days for current yeast infection.      Alanson Puls, M.D.  ______________________________  Conni Elliot, M.D.    MR/MEDQ  D:  07/22/2005  T:  07/22/2005  Job:  [id number]

## 2010-08-20 NOTE — Discharge Summary (Signed)
[name], MCCREERY             ACCOUNT NO.:  [id number]   MEDICAL RECORD NO.:  [id number]          PATIENT TYPE:  INP   LOCATION:  9319                          [facility]   PHYSICIAN:  Phil D. Okey Dupre, M.D.     DATE OF BIRTH:  [birth year]   DATE OF ADMISSION:  10/27/2005  DATE OF DISCHARGE:  11/01/2005                                 DISCHARGE SUMMARY   CONDITION ON DISCHARGE:  Patient is stable and improved.   ADMISSION DIAGNOSES:  1.  Intrauterine growth retardation and growth at eleven percent on      ultrasound.  2.  Severe preeclampsia.  3.  Oligohydramnios.   DISCHARGE DIAGNOSES:  1.  Patient delivered a preterm 31 week 1 day infant.  2.  Discoid lupus.  3.  History of previous cesarean section.  4.  Intrauterine growth retardation.  5.  Severe preeclampsia.  6.  Marijuana use.  7.  Chronic hypertension.   HOSPITAL COURSE:  Patient was admitted on October 27, 2005.  She was a 30-year-  old, G3, P 1-0-1-1, at 30 weeks and 6 days by a 6-week ultrasound.  She was  admitted due to intrauterine growth retardation and her growth was 11% by  ultrasound, with an AFI of less than the fifth percentile.  The patient's  blood pressure had also ranged in the severe range, and thus she was also  diagnosed with severe preeclampsia.  Perinatology was consulted and  recommended delivery after her second dose of betamethasone.  Patient  underwent LTCS on October 29, 2005.  A viable female infant was delivered in  footling breech presentation.  Apgars were 7 and 9.  There were small  fibroids in the uterus.  NICU was there for the delivery.  Please see C-  section note OP report for more details.  Also, patient was started on  magnesium prior to her section, and continued afterwards for 24 hours.  Patient had a relatively normal postpartum course.  She did have some  elevated blood pressures, and on discharge her blood pressures are still in  the 150s/106-ish range.  However, her PIH labs  have been better, and she is  not experiencing any other signs or symptoms of preeclampsia at this point.  Patient most likely has chronic hypertension, and was started on HCTZ 25 mg  and Norvasc 10 mg q. day.  She will follow up with a primary care physician  at [facility] for this problem.  Patient is bottle feeding her  child, and desires Ortho Tri-Cyclen for contraception.  Prescription was  given.  Her child is currently in the NICU, but is off the ventilator.  As  for her discoid lupus, she did not have any problems with this while she was  here.  Patient will return in 2-5 days to get her staples out, in the MAU,  and follow up in 6 weeks with [facility].  Patient's antenatal labs  include platelets 306, blood type AB+, antibody screen negative, rubella  immune, hepatitis C negative, RPR negative, GC/Chlamydia negative, and a Pap  smear within normal  limits.  Labs in the hospital include a urine drug  screen positive for marijuana.  Urine protein at one point was 8085.  Her  hemoglobin and hematocrit, on October 30, 2005, were 11.7 and 36.5.  Patient's  last PIH labs were done on October 30, 2005, and include a uric acid level of  5.1, but patient will still be treated with magnesium at this point.  LDH of  193.  Patient's GBS was positive on October 27, 2005.   PROCEDURES:  1.  Ultrasound on October 29, 2005, showing no previa, posterior placenta,      breech presentation, amniotic fluid decreased at 8.2 cm, AFI biophysical      profile 6/8.  2.  Cord Dopplers on October 29, 2005, showed abnormally high umbilical artery      systolic to diastolic ratio of 7.21, abnormal low middle cerebral artery      pulsatility index, a BPP of 6/8 and oligohydramnios, as well as resolved      right pyelectasis with mildly progressive left pyelectasis.   INSTRUCTIONS TO PATIENT:  Patient is to be on pelvic rest times 6 weeks, and  to follow up with [facility] in 6 weeks for her postpartum  appointment.  She is to follow up with the [facility].  Her appointment has been set up with Dr. Raechel Ache to follow her  hypertension, that we believe to be chronic, on November 07, 2005, at 3  o'clock.  She will return to the MAU in 2-4 days to have her staples  removed.   DISCHARGE MEDICATIONS:  1.  Ortho Tri-Cyclen.  Patient is to start this two Sundays after discharge,      one tablet p.o. daily per instructions.  2.  Percocet 5/325 mg 1-2 tablets p.o. q.4-6 hours p.r.n. pain unrelieved by      ibuprofen, #20.  3.  HCTZ 25 mg q. day, 1 tablet p.o., #30.  4.  Norvasc 10 mg 1 tablet p.o. daily.  5.  Patient will also be given ibuprofen 600 mg p.o. q.6 hours p.r.n.      cramps.  6.  Colace 100 mg daily to avoid constipation.  7.  Prenatal vitamins 1 p.o. daily times 6 weeks.   Will have [employer] nurse come check her blood pressure tomorrow.     ______________________________  Alanda Amass, M.D.    ______________________________  Javier Glazier. Okey Dupre, M.D.    JH/MEDQ  D:  11/01/2005  T:  11/01/2005  Job:  [id number]

## 2010-08-20 NOTE — Discharge Summary (Signed)
NAMEJONITA, [name]             ACCOUNT NO.:  [id number]   MEDICAL RECORD NO.:  [id number]          PATIENT TYPE:  INP   LOCATION:  9305                          FACILITY:  [facility]   PHYSICIAN:  Roseanna Rainbow, M.D.DATE OF BIRTH:  21-Jun-1980   DATE OF ADMISSION:  11/27/2006  DATE OF DISCHARGE:  12/01/2006                               DISCHARGE SUMMARY   CHIEF COMPLAINT:  The patient is a 30 year old para 2 with an estimated  date of confinement of December 20 with an intrauterine pregnancy at 22+  weeks, complaining of lower back pain.   HISTORY OF PRESENT ILLNESS:  She reports the onset of the lower back  pain 1 month prior to presentation.  Recently, she was treated for a  urinary tract infection.  She has a history of myalgias with her lupus  flares.   ALLERGIES:  No known drug allergies.   MEDICATIONS:  Macrobid and prenatal vitamins.   SOCIAL HISTORY:  She denies any tobacco, ethanol or drug use.   OBSTETRICAL RISK FACTORS:  Urinary tract infection, SLE, history of  preterm deliveries, history of PIH, history of a previous cesarean  delivery.   PAST MEDICAL HISTORY:  Please see the above.   PAST SURGICAL HISTORY:  Please see the above.   PHYSICAL EXAM:  VITAL SIGNS:  Stable, afebrile.  Fetal heart rate okay  by Doppler.  BACK:  Bilateral costovertebral angle tenderness appreciated.  ABDOMEN:  Nontender.  PELVIC:  Sterile vaginal exam per the R.N.  The cervix is long, closed  and posterior.   LABORATORY DATA:  August 25 urinalysis:  Positive nitrites, trace  leukocyte esterase.  Comprehensive metabolic profile normal.  White  blood cell count 9500, hemoglobin 10.9, platelets 227,000.   ASSESSMENT AND PLAN:  1. Rule out urinary tract infection.  Differential diagnosis includes      a systemic lupus erythematosus flare.  2. Second trimester pregnancy.   PLAN:  Admission, check urinary culture and sensitivity, check  complement levels, broad-spectrum  parenteral antibiotics, Rheumatology  consult.   HOSPITAL COURSE:  The complement levels of C3 and C4 were normal.  Urine  culture and sensitivity was remarkable for greater than 100,000 colonies  per milliliter of E. coli.  Her pain gradually improved.  The sed rate  was normal.  She was subsequently discharged to home on August 29.   DISCHARGE DIAGNOSES:  1. Pyelonephritis.  2. Second trimester pregnancy.   CONDITION:  Stable.   DIET:  Regular.   ACTIVITY:  Ad lib.   MEDICATIONS:  Continue preadmission medications and Ceftin.   DISPOSITION:  The patient was to follow up in the office in 2 weeks.      Roseanna Rainbow, M.D.  Electronically Signed     LAJ/MEDQ  D:  12/31/2006  T:  01/01/2007  Job:  [id number]

## 2010-08-20 NOTE — Op Note (Signed)
[name], [name]             ACCOUNT NO.:  [id number]   MEDICAL RECORD NO.:  [id number]          PATIENT TYPE:  INP   LOCATION:  9374                          FACILITY:  [facility]   PHYSICIAN:  Tracy L. Mayford Knife, M.D.DATE OF BIRTH:  1980/11/04   DATE OF PROCEDURE:  10/29/2005  DATE OF DISCHARGE:                                 OPERATIVE REPORT   PREOPERATIVE DIAGNOSES:  1.  A 31-week 1-day intrauterine pregnancy.  2.  Severe preeclampsia.  3.  Oligohydramnios.  4.  Normal Dopplers.  5.  Intrauterine growth retardation.  6.  Discoid lupus.  7.  History of previous cesarean section.   POSTOPERATIVE DIAGNOSES:  1.  A 31-week 1-day intrauterine pregnancy.  2.  Severe preeclampsia.  3.  Oligohydramnios.  4.  Normal Dopplers.  5.  Intrauterine growth retardation.  6.  Discoid lupus.  7.  History of previous cesarean section.   PROCEDURE:  Repeat low transverse cesarean section via Pfannenstiel.   SURGEON:  Elinor Dodge, MD   ASSISTANT:  Carolanne Grumbling, MD   ANESTHESIA:  Spinal.   COMPLICATIONS:  None.   ESTIMATED BLOOD LOSS:  600 mL.   IV FLUID:  2000 mL.   URINE OUTPUT:  400 mL of clear urine at the end of the procedure.   INDICATIONS:  The patient is a 30 year old G3, P 1-0-1-1 at 31 weeks 1 day  by 6-week ultrasound who is being  delivered because she meets criteria for  severe preeclampsia.  She makes the diagnosis based on she has  oligohydramnios with an AFI less than the 5th percentile and IUGR.  The  patient's blood pressures had also ranged in the severe range.  Perinatology  was consulted and they recommended delivery following her second dose of  betamethasone.   FINDINGS:  Female infant in the footling breech presentation.  Apgars 7 and 9.  Cord pH 7.27.  Normal uterus except there were small fibroids.  Normal tubes  and ovaries.   PROCEDURE:  The patient was taken to the operating room where spinal  anesthesia was placed and found to be adequate.  She was  then prepped and  draped in the normal sterile fashion in the dorsal supine position with a  leftward tilt.  A Pfannenstiel skin incision was made with a scalpel and  carried through to the underlying layer of fascia.  Fascia was incised in  the midline and extended laterally with the Mayos.  The inferior aspect of  the fascial incision was grasped with Kocher clamps, elevated and underlying  rectus muscles dissected off bluntly.  Attention was then turned the  superior aspect of the incision which in a similar fashion was grasped,  tented up with Kocher clamps, and the rectus muscles was dissected off  bluntly.  The rectus muscles were separated in the midline and the  peritoneum identified, tented up and entered sharply with the Metzenbaum  scissors.  Peritoneal incision was extended superiorly and inferiorly with  good visualization of the bladder.  Bladder blade was inserted and the  vesicouterine peritoneum identified, grasped with pickups and entered  sharply with  the Metzenbaum scissors.  Incision was extended laterally and  bladder flap was attempted to be made but it was difficult secondary to  adhesions.   Bladder blade was reinserted and the lower uterine segment incised in a  transverse fashion with the scalpel.  Uterine incision was extended  laterally with the bandage scissors.  Bladder blade was removed and one of  the infant's legs was delivered atraumatically followed by the next leg.  The rest of the body was delivered atraumatically and the baby's mouth bulb  suctioned.  Cord clamped and cut.  Infant was handed to the waiting  neonatologist.  Cord gases were sent.   Placenta was removed manually.  The uterus was not exteriorized but it was  cleared of all clots and debris.  The uterine incision was repaired with O  Vicryl in a running locked fashion.  A second layer of the same suture was  used to obtain excellent hemostasis.  Fascia was reapproximated with O  Vicryl  in a running fashion.  Skin was closed with staples.  The patient  tolerated the procedure well.  Sponge, lap and needle counts were correct  x2.  Two grams of Ancef was given at cord clamp.  The patient was taken to  the recovery room in stable condition and was started on magnesium.           ______________________________  Marc Morgans Mayford Knife, M.D.     TLW/MEDQ  D:  10/29/2005  T:  10/30/2005  Job:  [id number]

## 2010-08-20 NOTE — Discharge Summary (Signed)
[name], [name]             ACCOUNT NO.:  [id number]   MEDICAL RECORD NO.:  [id number]          PATIENT TYPE:  WOC   LOCATION:  WOC                          FACILITY:  [facility]   PHYSICIAN:  Angie B. Merlene Morse, MD  DATE OF BIRTH:  [birth year]   DATE OF ADMISSION:  07/22/2005  DATE OF DISCHARGE:  07/24/2005                                 DISCHARGE SUMMARY   DIAGNOSES:  1.  Intrauterine synechia with intrauterine pregnancy.  2.  Pyelonephritis.  3.  Discoid lupus.  4.  History of PIH with previous pregnancy.   DISCHARGE DIAGNOSES:  1.  Intrauterine synechia with intrauterine pregnancy.  2.  Pyelonephritis.  3.  Discoid lupus.  4.  History of PIH with previous pregnancy.   ADMISSION HISTORY AND PHYSICAL:  The patient is a 30 year old G3, P1-0-1-1,  17 weeks, 5 week ultrasound who was admitted after being treated for urinary  tract infection who now at this time has initial complaint of CVA  tenderness.  She denied any nausea, vomiting or urinary symptoms or fever at  home.  Please see dictated H&P for more information.   HOSPITAL COURSE:  The patient remained stable while in the hospital.  Her  CVA tenderness resolved.  She remained afebrile.  She was treated with  ceftriaxone 1 g IV q.12 h for the duration of her hospitalization.  She did  have a urine culture which grew out 8000 colonies of E. coli.  The patient  was discharged to home in stable condition.   DISCHARGE INSTRUCTIONS:  The patient was discharged to home.  She was to  follow up with __________ [facility] on April 26 as previously scheduled.   DISCHARGE MEDICATIONS:  1.  Macrobid 100 mg once daily after her Ceftin is finished.  Ceftin 500 mg      one b.i.d. #23, Diflucan 150 mg one p.o. x1.           ______________________________  Karoline Caldwell B. Merlene Morse, MD     ABC/MEDQ  D:  07/24/2005  T:  07/26/2005  Job:  [id number]

## 2010-12-23 LAB — POCT RAPID STREP A: Streptococcus, Group A Screen (Direct): NEGATIVE

## 2011-01-07 LAB — CBC
HCT: 30 — ABNORMAL LOW
HCT: 34 — ABNORMAL LOW
Hemoglobin: 11 — ABNORMAL LOW
Hemoglobin: 9.8 — ABNORMAL LOW
MCHC: 32.4
MCHC: 32.5
MCV: 68.8 — ABNORMAL LOW
MCV: 68.8 — ABNORMAL LOW
Platelets: 168
Platelets: 202
RBC: 4.37
RBC: 4.94
RDW: 15.2
RDW: 15.4
WBC: 7.7
WBC: 8.1

## 2011-01-07 LAB — RPR: RPR Ser Ql: NONREACTIVE

## 2011-01-10 LAB — CBC
HCT: 34.9 — ABNORMAL LOW
Hemoglobin: 11.3 — ABNORMAL LOW
MCHC: 32.5
MCV: 68.7 — ABNORMAL LOW
Platelets: 214
RBC: 5.08
RDW: 14.9
WBC: 7.3

## 2011-01-10 LAB — RPR: RPR Ser Ql: NONREACTIVE

## 2011-01-11 LAB — COMPREHENSIVE METABOLIC PANEL
ALT: 11
AST: 17
Albumin: 2.7 — ABNORMAL LOW
Alkaline Phosphatase: 140 — ABNORMAL HIGH
BUN: 3 — ABNORMAL LOW
CO2: 23
Calcium: 8.8
Chloride: 104
Creatinine, Ser: 0.37 — ABNORMAL LOW
GFR calc Af Amer: >60
GFR calc non Af Amer: >60
Glucose, Bld: 95
Potassium: 3.4 — ABNORMAL LOW
Sodium: 133 — ABNORMAL LOW
Total Bilirubin: 0.2 — ABNORMAL LOW
Total Protein: 6.8

## 2011-01-11 LAB — CBC
HCT: 33.4 — ABNORMAL LOW
Hemoglobin: 10.8 — ABNORMAL LOW
MCHC: 32.3
MCV: 69.7 — ABNORMAL LOW
Platelets: 251
RBC: 4.79
RDW: 15.6 — ABNORMAL HIGH
WBC: 9.3

## 2011-01-11 LAB — CARDIOLIPIN ANTIBODIES, IGM+IGG
Anticardiolipin IgG: <7 — ABNORMAL LOW (ref <11)
Anticardiolipin IgM: <7 — ABNORMAL LOW (ref <10)

## 2011-01-11 LAB — LUPUS ANTICOAGULANT PANEL
DRVVT: 37.3 (ref 36.1–47.0)
Lupus Anticoagulant: NOT DETECTED
PTT Lupus Anticoagulant: 42.2 (ref 36.3–48.8)

## 2011-01-11 LAB — C4 COMPLEMENT: Complement C4, Body Fluid: 26

## 2011-01-11 LAB — C3 COMPLEMENT: C3 Complement: 165

## 2011-01-14 LAB — URINE MICROSCOPIC-ADD ON: RBC / HPF: NONE SEEN

## 2011-01-14 LAB — DIFFERENTIAL
Basophils Absolute: 0
Basophils Relative: 0
Eosinophils Absolute: 0.2
Eosinophils Relative: 2
Lymphocytes Relative: 29
Lymphs Abs: 2.8
Monocytes Absolute: 0.9 — ABNORMAL HIGH
Monocytes Relative: 9
Neutro Abs: 5.6
Neutrophils Relative %: 59

## 2011-01-14 LAB — ANTI-NUCLEAR AB-TITER (ANA TITER): ANA Titer 1: 1:80 {titer} — ABNORMAL HIGH

## 2011-01-14 LAB — COMPREHENSIVE METABOLIC PANEL
ALT: 10
AST: 13
Albumin: 2.9 — ABNORMAL LOW
Alkaline Phosphatase: 40
BUN: 4 — ABNORMAL LOW
CO2: 22
Calcium: 8.7
Chloride: 106
Creatinine, Ser: 0.45
GFR calc Af Amer: >60
GFR calc non Af Amer: >60
Glucose, Bld: 78
Potassium: 3.5
Sodium: 136
Total Bilirubin: 0.1 — ABNORMAL LOW
Total Protein: 6.6

## 2011-01-14 LAB — URINALYSIS, ROUTINE W REFLEX MICROSCOPIC
Bilirubin Urine: NEGATIVE
Glucose, UA: NEGATIVE
Hgb urine dipstick: NEGATIVE
Ketones, ur: NEGATIVE
Nitrite: POSITIVE — AB
Protein, ur: NEGATIVE
Specific Gravity, Urine: 1.025
Urobilinogen, UA: 0.2
pH: 6.5

## 2011-01-14 LAB — CBC
HCT: 33.1 — ABNORMAL LOW
Hemoglobin: 10.9 — ABNORMAL LOW
MCHC: 32.7
MCV: 69 — ABNORMAL LOW
Platelets: 227
RBC: 4.8
RDW: 17.8 — ABNORMAL HIGH
WBC: 9.5

## 2011-01-14 LAB — C3 COMPLEMENT: C3 Complement: 152

## 2011-01-14 LAB — URINE CULTURE: Colony Count: >=100000

## 2011-01-14 LAB — SJOGRENS SYNDROME-B EXTRACTABLE NUCLEAR ANTIBODY: SSB (La) (ENA) Antibody, IgG: <0.2 AI (ref <1.0)

## 2011-01-14 LAB — ANA: Anti Nuclear Antibody(ANA): POSITIVE — AB

## 2011-01-14 LAB — SEDIMENTATION RATE: Sed Rate: 10

## 2011-01-14 LAB — SJOGRENS SYNDROME-A EXTRACTABLE NUCLEAR ANTIBODY: SSA (Ro) (ENA) Antibody, IgG: <0.2 AI (ref <1.0)

## 2011-01-14 LAB — C4 COMPLEMENT: Complement C4, Body Fluid: 27

## 2013-03-29 ENCOUNTER — Emergency Department (HOSPITAL_COMMUNITY)
Admission: EM | Admit: 2013-03-29 | Discharge: 2013-03-29 | Disposition: A | Discharge status: Home / Self Care | Payer: Self-pay | Attending: Emergency Medicine | Admitting: Emergency Medicine

## 2013-03-29 ENCOUNTER — Encounter (HOSPITAL_COMMUNITY): Payer: Self-pay | Admitting: Emergency Medicine

## 2013-03-29 ENCOUNTER — Other Ambulatory Visit: Payer: Self-pay

## 2013-03-29 ENCOUNTER — Emergency Department (HOSPITAL_COMMUNITY): Payer: Self-pay

## 2013-03-29 DIAGNOSIS — R112 Nausea with vomiting, unspecified: Secondary | ICD-10-CM | POA: Insufficient documentation

## 2013-03-29 DIAGNOSIS — R509 Fever, unspecified: Secondary | ICD-10-CM | POA: Insufficient documentation

## 2013-03-29 DIAGNOSIS — F17200 Nicotine dependence, unspecified, uncomplicated: Secondary | ICD-10-CM | POA: Insufficient documentation

## 2013-03-29 DIAGNOSIS — E876 Hypokalemia: Secondary | ICD-10-CM | POA: Insufficient documentation

## 2013-03-29 DIAGNOSIS — Z8739 Personal history of other diseases of the musculoskeletal system and connective tissue: Secondary | ICD-10-CM | POA: Insufficient documentation

## 2013-03-29 DIAGNOSIS — R52 Pain, unspecified: Secondary | ICD-10-CM | POA: Insufficient documentation

## 2013-03-29 DIAGNOSIS — R0602 Shortness of breath: Secondary | ICD-10-CM | POA: Insufficient documentation

## 2013-03-29 DIAGNOSIS — J3489 Other specified disorders of nose and nasal sinuses: Secondary | ICD-10-CM | POA: Insufficient documentation

## 2013-03-29 DIAGNOSIS — F411 Generalized anxiety disorder: Secondary | ICD-10-CM | POA: Insufficient documentation

## 2013-03-29 DIAGNOSIS — R51 Headache: Secondary | ICD-10-CM | POA: Insufficient documentation

## 2013-03-29 DIAGNOSIS — L989 Disorder of the skin and subcutaneous tissue, unspecified: Secondary | ICD-10-CM | POA: Insufficient documentation

## 2013-03-29 DIAGNOSIS — J209 Acute bronchitis, unspecified: Secondary | ICD-10-CM | POA: Insufficient documentation

## 2013-03-29 HISTORY — DX: Reserved for concepts with insufficient information to code with codable children: IMO0002

## 2013-03-29 HISTORY — DX: Systemic lupus erythematosus, unspecified: M32.9

## 2013-03-29 LAB — CBC WITH DIFFERENTIAL/PLATELET
Basophils Absolute: 0 10*3/uL (ref 0.0–0.1)
Basophils Relative: 0 % (ref 0–1)
Eosinophils Absolute: 0.1 10*3/uL (ref 0.0–0.7)
Eosinophils Relative: 1 % (ref 0–5)
HCT: 35.5 % — ABNORMAL LOW (ref 36.0–46.0)
Hemoglobin: 11.7 g/dL — ABNORMAL LOW (ref 12.0–15.0)
Lymphocytes Relative: 38 % (ref 12–46)
Lymphs Abs: 2.4 10*3/uL (ref 0.7–4.0)
MCH: 21.5 pg — ABNORMAL LOW (ref 26.0–34.0)
MCHC: 33 g/dL (ref 30.0–36.0)
MCV: 65.1 fL — ABNORMAL LOW (ref 78.0–100.0)
Monocytes Absolute: 0.2 10*3/uL (ref 0.1–1.0)
Monocytes Relative: 4 % (ref 3–12)
Neutro Abs: 3.5 10*3/uL (ref 1.7–7.7)
Neutrophils Relative %: 57 % (ref 43–77)
Platelets: 214 10*3/uL (ref 150–400)
RBC: 5.45 MIL/uL — ABNORMAL HIGH (ref 3.87–5.11)
RDW: 16.4 % — ABNORMAL HIGH (ref 11.5–15.5)
WBC: 6.2 10*3/uL (ref 4.0–10.5)

## 2013-03-29 LAB — BASIC METABOLIC PANEL
BUN: 4 mg/dL — ABNORMAL LOW (ref 6–23)
CO2: 22 mEq/L (ref 19–32)
Calcium: 8.8 mg/dL (ref 8.4–10.5)
Chloride: 99 mEq/L (ref 96–112)
Creatinine, Ser: 0.47 mg/dL — ABNORMAL LOW (ref 0.50–1.10)
GFR calc Af Amer: >90 mL/min (ref >90)
GFR calc non Af Amer: >90 mL/min (ref >90)
Glucose, Bld: 87 mg/dL (ref 70–99)
Potassium: 3.1 mEq/L — ABNORMAL LOW (ref 3.5–5.1)
Sodium: 138 mEq/L (ref 135–145)

## 2013-03-29 LAB — POCT I-STAT TROPONIN I: Troponin i, poc: 0 ng/mL (ref 0.00–0.08)

## 2013-03-29 MED ORDER — IPRATROPIUM BROMIDE 0.02 % IN SOLN
0.5000 mg | Freq: Once | RESPIRATORY_TRACT | Status: AC
  Administered 2013-03-29: 0.5 mg via RESPIRATORY_TRACT
  Filled 2013-03-29: qty 2.5

## 2013-03-29 MED ORDER — NEBULIZERS MISC: Status: DC

## 2013-03-29 MED ORDER — ALBUTEROL SULFATE (5 MG/ML) 0.5% IN NEBU
5.0000 mg | INHALATION_SOLUTION | Freq: Once | RESPIRATORY_TRACT | Status: AC
  Administered 2013-03-29: 5 mg via RESPIRATORY_TRACT

## 2013-03-29 MED ORDER — ALBUTEROL SULFATE (5 MG/ML) 0.5% IN NEBU
5.0000 mg | INHALATION_SOLUTION | Freq: Once | RESPIRATORY_TRACT | Status: AC
  Administered 2013-03-29: 5 mg via RESPIRATORY_TRACT
  Filled 2013-03-29: qty 1

## 2013-03-29 MED ORDER — ALBUTEROL SULFATE (5 MG/ML) 0.5% IN NEBU: 5.0000 mg | INHALATION_SOLUTION | RESPIRATORY_TRACT | Status: DC | PRN

## 2013-03-29 MED ORDER — IPRATROPIUM BROMIDE 0.02 % IN SOLN
0.5000 mg | Freq: Once | RESPIRATORY_TRACT | Status: AC
  Administered 2013-03-29: 0.5 mg via RESPIRATORY_TRACT

## 2013-03-29 MED ORDER — HYDROCODONE-ACETAMINOPHEN 7.5-325 MG/15ML PO SOLN
10.0000 mL | Freq: Once | ORAL | Status: AC
  Administered 2013-03-29: 10 mL via ORAL
  Filled 2013-03-29: qty 15

## 2013-03-29 MED ORDER — ONDANSETRON HCL 4 MG/2ML IJ SOLN
4.0000 mg | Freq: Once | INTRAMUSCULAR | Status: AC
  Administered 2013-03-29: 4 mg via INTRAVENOUS
  Filled 2013-03-29: qty 2

## 2013-03-29 MED ORDER — HYDROCODONE-ACETAMINOPHEN 7.5-325 MG/15ML PO SOLN: 10.0000 mL | Freq: Four times a day (QID) | ORAL | Status: DC | PRN

## 2013-03-29 MED ORDER — OXYCODONE-ACETAMINOPHEN 5-325 MG PO TABS
1.0000 | ORAL_TABLET | Freq: Once | ORAL | Status: AC
  Administered 2013-03-29: 1 via ORAL
  Filled 2013-03-29: qty 1

## 2013-03-29 MED ORDER — LORAZEPAM 2 MG/ML IJ SOLN
1.0000 mg | Freq: Once | INTRAMUSCULAR | Status: AC
  Administered 2013-03-29: 1 mg via INTRAVENOUS
  Filled 2013-03-29: qty 1

## 2013-03-29 MED ORDER — IPRATROPIUM BROMIDE 0.02 % IN SOLN
RESPIRATORY_TRACT | Status: AC
  Filled 2013-03-29: qty 2.5

## 2013-03-29 MED ORDER — POTASSIUM CHLORIDE CRYS ER 20 MEQ PO TBCR
40.0000 meq | EXTENDED_RELEASE_TABLET | Freq: Once | ORAL | Status: AC
  Administered 2013-03-29: 40 meq via ORAL
  Filled 2013-03-29: qty 2

## 2013-03-29 MED ORDER — ALBUTEROL SULFATE HFA 108 (90 BASE) MCG/ACT IN AERS
2.0000 | INHALATION_SPRAY | RESPIRATORY_TRACT | Status: DC | PRN
  Administered 2013-03-29: 2 via RESPIRATORY_TRACT
  Filled 2013-03-29: qty 6.7

## 2013-03-29 MED ORDER — ALBUTEROL SULFATE (5 MG/ML) 0.5% IN NEBU
INHALATION_SOLUTION | RESPIRATORY_TRACT | Status: AC
  Filled 2013-03-29: qty 1

## 2013-03-29 MED ORDER — FENTANYL CITRATE 0.05 MG/ML IJ SOLN
50.0000 ug | Freq: Once | INTRAMUSCULAR | Status: AC
  Administered 2013-03-29: 50 ug via INTRAVENOUS
  Filled 2013-03-29: qty 2

## 2013-03-29 MED ORDER — ONDANSETRON 4 MG PO TBDP
4.0000 mg | ORAL_TABLET | Freq: Once | ORAL | Status: AC
  Administered 2013-03-29: 4 mg via ORAL
  Filled 2013-03-29: qty 1

## 2013-03-29 NOTE — ED Provider Notes (Signed)
CSN: [id number]     Arrival date & time 03/29/13  0149 History   First MD Initiated Contact with Patient 03/29/13 0159     Chief Complaint  Patient presents with  . Shortness of Breath   (Consider location/radiation/quality/duration/timing/severity/associated sxs/prior Treatment) HPI This is a 32 year old female with a one-week history of flulike symptoms. Specifically she has had nasal congestion, sinus congestion, fever, body aches and cough. She has been taking over-the-counter medications with some relief. Yesterday he she developed nausea and vomiting though it is unclear if this is post tussive emesis. She is here now with shortness of breath the onset of which was fairly sudden over the last several hours. She is hyperventilating on arrival. Oxygen saturation was 100% on room air.  Past Medical History  Diagnosis Date  . Lupus    History reviewed. No pertinent past surgical history. History reviewed. No pertinent family history. History  Substance Use Topics  . Smoking status: Current Some Day Smoker -- 0.25 packs/day  . Smokeless tobacco: Never Used  . Alcohol Use: Yes   OB History   Grav Para Term Preterm Abortions TAB SAB Ect Mult Living                 Review of Systems  All other systems reviewed and are negative.    Allergies  Review of patient's allergies indicates no known allergies.  Home Medications  No current outpatient prescriptions on file. BP 138/97  Pulse 92  Temp(Src) 98.7 F (37.1 C) (Oral)  Resp 34  SpO2 100%  LMP 03/26/2013  Physical Exam General: Well-developed, well-nourished female; hyperventilating; appearance consistent with age of record HENT: normocephalic; atraumatic Eyes: pupils equal, round and reactive to light; extraocular muscles intact Neck: supple Heart: regular rate and rhythm Lungs: Tachypnea; coarse sounds bilaterally with mildly decreased air movement Abdomen: soft; nondistended; nontender; bowel sounds  present Extremities: No deformity; full range of motion; pulses normal Neurologic: Awake, alert; motor function intact in all extremities and symmetric; no facial droop Skin: Warm and dry; right malar rash Psychiatric: Anxious; agitated    ED Course  Procedures (including critical care time)     MDM  Nursing notes and vitals signs, including pulse oximetry, reviewed.  Summary of this visit's results, reviewed by myself:  Labs:  No results found for this or any previous visit (from the past 24 hour(s)).  Imaging Studies: Dg Chest Port 1 View  03/29/2013   CLINICAL DATA:  Shortness of breath and wheezing. History of smoking.  EXAM: PORTABLE CHEST - 1 VIEW  COMPARISON:  None.  FINDINGS: The lungs are mildly hypoexpanded. Increased density at the periphery of the left lung base is thought to reflect overlying soft tissues. No pleural effusion or pneumothorax is seen.  The cardiomediastinal silhouette remains normal in size. No acute osseous abnormalities are identified.  IMPRESSION: Lungs mildly hypoexpanded but grossly clear.   Electronically Signed   By: Roanna Raider M.D.   On: 03/29/2013 02:29    Date: 03/29/2013 1:53 AM  Rate: 92  Rhythm: normal sinus rhythm  QRS Axis: normal  Intervals: normal  ST/T Wave abnormalities: normal  Conduction Disutrbances: none  Narrative Interpretation: unremarkable  Comparison with previous EKG: none available  3:00 AM Lungs clear with improved air movement after albuterol and Atrovent neb treatment. Patient still anxious and hyperventilating, stating she still feels a strong urge to cough.  4:13 AM Resting calmly. Given an albuterol inhaler and instructions.     Hanley Seamen, MD  03/29/13 0413 

## 2013-03-29 NOTE — ED Notes (Signed)
Per pt's Aunt, Pt called her Aunt stating she could not catch her breath, pt has been having flu-like symptoms for the past week, has been taking OTC cold medications, pt hyperventilating upon admission to ED, 100% on RA, pt has a congested cough noted as well.

## 2013-03-29 NOTE — ED Notes (Signed)
PT asked for pain medication for 9/10 pain. Provider notified

## 2013-03-29 NOTE — ED Notes (Signed)
Per pt sts vomiting all night, HA and SOB. Pt breathing fast in triage but stopping to answer questions with no DISTRESS. Pt coughing

## 2013-03-29 NOTE — ED Provider Notes (Signed)
CSN: [id number]     Arrival date & time 03/29/13  1803 History   First MD Initiated Contact with Patient 03/29/13 1945     Chief Complaint  Patient presents with  . Emesis  . Shortness of Breath  . Headache   (Consider location/radiation/quality/duration/timing/severity/associated sxs/prior Treatment) The history is provided by the patient and medical records.   This is a 32 y.o. F with PMH significant for Lupus presenting to the ED for persistent vomiting and SOB.  Pt was seen at [facility] earlier today for the same.  Mother at beside states sx initially began with nasal congestion, fever, body aches, and cough.  States she has been using prescribed albuterol inhaler and hycet but states medications seemed to make the vomiting worse.  Pt states she feels she is having difficulty "catching her breath."  No prior hx of asthma.  Pt also complains of a "burning sensation" to her frontal scalp.  Pt does have areas of scaling and skin erosion on frontal scalp.  No topical treatment tried.  No headaches, dizziness, or weakness.  VS stable on arrival, O2 sats 99% on RA.  Past Medical History  Diagnosis Date  . Lupus    History reviewed. No pertinent past surgical history. History reviewed. No pertinent family history. History  Substance Use Topics  . Smoking status: Current Some Day Smoker -- 0.25 packs/day  . Smokeless tobacco: Never Used  . Alcohol Use: Yes   OB History   Grav Para Term Preterm Abortions TAB SAB Ect Mult Living                 Review of Systems  Respiratory: Positive for shortness of breath.   All other systems reviewed and are negative.    Allergies  Review of patient's allergies indicates no known allergies.  Home Medications   Current Outpatient Rx  Name  Route  Sig  Dispense  Refill  . HYDROcodone-acetaminophen (HYCET) 7.5-325 mg/15 ml solution   Oral   Take 10 mLs by mouth every 6 (six) hours as needed (for cough or pain).   200 mL   0    BP 98/63   Pulse 99  Temp(Src) 99.4 F (37.4 C) (Oral)  Resp 20  SpO2 99%  LMP 03/26/2013  Physical Exam  Nursing note and vitals reviewed. Constitutional: She is oriented to person, place, and time. She appears well-developed and well-nourished. No distress.  HENT:  Head: Normocephalic and atraumatic.  Mouth/Throat: Uvula is midline, oropharynx is clear and moist and mucous membranes are normal. No oropharyngeal exudate, posterior oropharyngeal edema, posterior oropharyngeal erythema or tonsillar abscesses.  Eyes: Conjunctivae and EOM are normal. Pupils are equal, round, and reactive to light.  Neck: Normal range of motion. Neck supple.  Cardiovascular: Normal rate, regular rhythm and normal heart sounds.   Pulmonary/Chest: Effort normal and breath sounds normal. She has no wheezes. She has no rhonchi.  Pt hyperventilating during exam; lungs CTAB  Musculoskeletal: Normal range of motion. She exhibits no edema.  Neurological: She is alert and oriented to person, place, and time.  Skin: Skin is warm and dry. She is not diaphoretic.  Scaling and skin erosion of right nasolabial fold and frontal scalp; no superimposed infection present  Psychiatric: Her speech is normal. Her mood appears anxious.    ED Course  Procedures (including critical care time) Labs Review Labs Reviewed  CBC WITH DIFFERENTIAL - Abnormal; Notable for the following:    RBC 5.45 (*)    Hemoglobin 11.7 (*)  HCT 35.5 (*)    MCV 65.1 (*)    MCH 21.5 (*)    RDW 16.4 (*)    All other components within normal limits  BASIC METABOLIC PANEL - Abnormal; Notable for the following:    Potassium 3.1 (*)    BUN 4 (*)    Creatinine, Ser 0.47 (*)    All other components within normal limits  POCT I-STAT TROPONIN I   Imaging Review Dg Chest Port 1 View  03/29/2013   CLINICAL DATA:  Shortness of breath and wheezing. History of smoking.  EXAM: PORTABLE CHEST - 1 VIEW  COMPARISON:  None.  FINDINGS: The lungs are mildly  hypoexpanded. Increased density at the periphery of the left lung base is thought to reflect overlying soft tissues. No pleural effusion or pneumothorax is seen.  The cardiomediastinal silhouette remains normal in size. No acute osseous abnormalities are identified.  IMPRESSION: Lungs mildly hypoexpanded but grossly clear.   Electronically Signed   By: Roanna Raider M.D.   On: 03/29/2013 02:29    EKG Interpretation   None       MDM   1. Shortness of breath   2. Hypokalemia    Upon entering pts room, she was lying in bed on her side breathing normally.  Began talking with pts mother and pt began flailing around in bed and hyperventilating.  Mother is concerned due to underlying Lupus.  Will check basic labs.  CXR from earlier today as above-- no active disease.  O2 sats remain 99-100% on RA.  Will give nebulizer treatment.  Pt given nebulizer treatment with good improvement of sx per pt.  Pt did have a few episodes of hypoxia secondary to intermittent hyperventilation, pt now calm and breathing normally with O2 sats stable on RA.  Pt has had no episodes of active vomiting in the ED.  Mild hypokalemia at 3.1-- replaced in the ED.  Pt afebrile, non-toxic appearing, NAD, VS stable- ok for discharge.  Rx nebulizer machine and soln at pt and mothers request.  FU with [facility] if problems occur.  Discussed plan with pt and mom, they agreed.  Return precautions advised.  Garlon Hatchet, PA-C 03/30/13 0118  Garlon Hatchet, PA-C 03/30/13 [phone]

## 2013-03-30 NOTE — ED Provider Notes (Signed)
Medical screening examination/treatment/procedure(s) were performed by non-physician practitioner and as supervising physician I was immediately available for consultation/collaboration.  EKG Interpretation    Date/Time:  Friday March 29 2013 20:22:11 EST Ventricular Rate:  88 PR Interval:  137 QRS Duration: 101 QT Interval:  381 QTC Calculation: 461 R Axis:   74 Text Interpretation:  Sinus rhythm RSR' in V1 or V2, right VCD or RVH no acute ischemia No old tracing to compare Confirmed by Toussaint Golson  MD, Sophya Vanblarcom ([id number]) on 03/30/2013 1:41:20 AM              Audree Camel, MD 03/30/13 [phone]

## 2013-04-01 LAB — PATHOLOGIST SMEAR REVIEW

## 2013-08-29 ENCOUNTER — Ambulatory Visit: Payer: Self-pay | Attending: Internal Medicine

## 2013-09-19 ENCOUNTER — Ambulatory Visit: Payer: Self-pay | Admitting: Internal Medicine

## 2014-04-30 ENCOUNTER — Inpatient Hospital Stay (HOSPITAL_COMMUNITY)
Admission: AD | Admit: 2014-04-30 | Discharge: 2014-04-30 | Disposition: A | Discharge status: Home / Self Care | Payer: Self-pay | Source: Ambulatory Visit | Attending: Family Medicine | Admitting: Family Medicine

## 2014-04-30 ENCOUNTER — Encounter (HOSPITAL_COMMUNITY): Payer: Self-pay | Admitting: *Deleted

## 2014-04-30 DIAGNOSIS — A499 Bacterial infection, unspecified: Secondary | ICD-10-CM

## 2014-04-30 DIAGNOSIS — N760 Acute vaginitis: Secondary | ICD-10-CM

## 2014-04-30 DIAGNOSIS — N390 Urinary tract infection, site not specified: Secondary | ICD-10-CM | POA: Insufficient documentation

## 2014-04-30 DIAGNOSIS — B9689 Other specified bacterial agents as the cause of diseases classified elsewhere: Secondary | ICD-10-CM | POA: Insufficient documentation

## 2014-04-30 DIAGNOSIS — Z87891 Personal history of nicotine dependence: Secondary | ICD-10-CM | POA: Insufficient documentation

## 2014-04-30 DIAGNOSIS — N3001 Acute cystitis with hematuria: Secondary | ICD-10-CM

## 2014-04-30 LAB — URINALYSIS, ROUTINE W REFLEX MICROSCOPIC
Bilirubin Urine: NEGATIVE
Glucose, UA: NEGATIVE mg/dL
Ketones, ur: NEGATIVE mg/dL
Nitrite: POSITIVE — AB
Protein, ur: NEGATIVE mg/dL
Specific Gravity, Urine: 1.025 (ref 1.005–1.030)
Urobilinogen, UA: 0.2 mg/dL (ref 0.0–1.0)
pH: 6 (ref 5.0–8.0)

## 2014-04-30 LAB — URINE MICROSCOPIC-ADD ON

## 2014-04-30 LAB — GC/CHLAMYDIA PROBE AMP (~~LOC~~) NOT AT ARMC
Chlamydia: POSITIVE — AB
Neisseria Gonorrhea: NEGATIVE

## 2014-04-30 LAB — HEPATITIS B SURFACE ANTIGEN: Hepatitis B Surface Ag: NEGATIVE

## 2014-04-30 LAB — WET PREP, GENITAL
Trich, Wet Prep: NONE SEEN
Yeast Wet Prep HPF POC: NONE SEEN

## 2014-04-30 LAB — POCT PREGNANCY, URINE: Preg Test, Ur: NEGATIVE

## 2014-04-30 LAB — HEPATITIS C ANTIBODY: HCV Ab: NEGATIVE

## 2014-04-30 MED ORDER — CIPROFLOXACIN HCL 250 MG PO TABS: 250.0000 mg | ORAL_TABLET | Freq: Two times a day (BID) | ORAL | Status: DC

## 2014-04-30 MED ORDER — METRONIDAZOLE 500 MG PO TABS: 500.0000 mg | ORAL_TABLET | Freq: Two times a day (BID) | ORAL | Status: DC

## 2014-04-30 NOTE — MAU Provider Note (Signed)
History     CSN: [id number]  Arrival date and time: 04/30/14 4098   First Provider Initiated Contact with Patient 04/30/14 0048      Chief Complaint  Patient presents with  . Abdominal Pain  . Vaginal Discharge   HPI 34 y.o. [phone] presents with STD exposure. Pt reports her boyfriend of 15 years was recently diagnosed with an undisclosed bacterial infection from a third party. Pt requests STD testing at this time. Pt reports bil lower abd pain "that's pretty much normal for my lupus boils" x months, worse over the last year. Pt also reports clear vaginal discharge changed to thick brown in the last week. Pt denies vaginal bleeding, ulcerations, pain with intercourse, fever/chills, n/v. Pt denies previous treatment for STDs, reports last pap smear 2 years ago, denies abnormalities. Pertinent Gynecological History: Menses: regular every month without intermenstrual spotting Bleeding: none Contraception: none DES exposure: denies Blood transfusions: none Sexually transmitted diseases: no past history Previous GYN Procedures: None  Last mammogram: N/A Date: N/A Last pap: normal Date: 2013    Past Medical History  Diagnosis Date  . Lupus     Past Surgical History  Procedure Laterality Date  . Tubal ligation      History reviewed. No pertinent family history.  History  Substance Use Topics  . Smoking status: Former Smoker -- 0.25 packs/day  . Smokeless tobacco: Never Used  . Alcohol Use: No     Comment: NONE  X6 MTHS    Allergies: No Known Allergies  Prescriptions prior to admission  Medication Sig Dispense Refill Last Dose  . albuterol (PROVENTIL) (5 MG/ML) 0.5% nebulizer solution Take 1 mL (5 mg total) by nebulization every 4 (four) hours as needed for wheezing or shortness of breath. 20 mL 0   . HYDROcodone-acetaminophen (HYCET) 7.5-325 mg/15 ml solution Take 10 mLs by mouth every 6 (six) hours as needed (for cough or pain). 200 mL 0 03/29/2013 at Unknown time  .  Nebulizers MISC Use with albuterol solution Q4H PRN wheezing 1 each 0     Review of Systems  Constitutional: Negative for fever, chills and malaise/fatigue.  Respiratory: Negative for shortness of breath.   Cardiovascular: Negative for chest pain.  Gastrointestinal: Positive for abdominal pain. Negative for heartburn, nausea, vomiting, diarrhea, constipation, blood in stool and melena.  Genitourinary: Negative for dysuria, urgency, frequency, hematuria and flank pain.  Musculoskeletal: Negative for back pain.  Skin: Negative for itching and rash.  Neurological: Negative for weakness.   Physical Exam   Blood pressure 112/76, pulse 75, temperature 98.3 F (36.8 C), temperature source Oral, resp. rate 20, height 5' (1.524 m), weight 170 lb 4 oz (77.225 kg), last menstrual period 04/07/2014.  Physical Exam  Constitutional: She is oriented to person, place, and time. She appears well-developed and well-nourished.  Cardiovascular: Normal rate and normal heart sounds.   Respiratory: Effort normal and breath sounds normal. No respiratory distress.  GI: Soft. Bowel sounds are normal. She exhibits no distension and no mass. There is no tenderness. There is no rebound and no guarding.  Genitourinary: Vagina normal.  NEFG, small amount of thick white vaginal discharge with foul odor. No adnexal tenderness or masses, no CMT  Neurological: She is alert and oriented to person, place, and time.  Skin: Skin is warm and dry.  Pt has several healed scars on abd pannus, no warmth, erythema or discharge.  Psychiatric: She has a normal mood and affect. Her behavior is normal. Judgment and thought content normal.  MAU Course  Procedures  MDM HIV, RPR, Hepatitis Panel, GC, Wet Prep Results for orders placed or performed during the hospital encounter of 04/30/14 (from the past 24 hour(s))  Urinalysis, Routine w reflex microscopic     Status: Abnormal   Collection Time: 04/30/14 12:20 AM  Result Value  Ref Range   Color, Urine YELLOW YELLOW   APPearance CLEAR CLEAR   Specific Gravity, Urine 1.025 1.005 - 1.030   pH 6.0 5.0 - 8.0   Glucose, UA NEGATIVE NEGATIVE mg/dL   Hgb urine dipstick SMALL (A) NEGATIVE   Bilirubin Urine NEGATIVE NEGATIVE   Ketones, ur NEGATIVE NEGATIVE mg/dL   Protein, ur NEGATIVE NEGATIVE mg/dL   Urobilinogen, UA 0.2 0.0 - 1.0 mg/dL   Nitrite POSITIVE (A) NEGATIVE   Leukocytes, UA MODERATE (A) NEGATIVE  Urine microscopic-add on     Status: Abnormal   Collection Time: 04/30/14 12:20 AM  Result Value Ref Range   Squamous Epithelial / LPF FEW (A) RARE   WBC, UA 11-20 <3 WBC/hpf   RBC / HPF 7-10 <3 RBC/hpf   Bacteria, UA MANY (A) RARE  Pregnancy, urine POC     Status: None   Collection Time: 04/30/14 12:31 AM  Result Value Ref Range   Preg Test, Ur NEGATIVE NEGATIVE  Wet prep, genital     Status: Abnormal   Collection Time: 04/30/14  1:00 AM  Result Value Ref Range   Yeast Wet Prep HPF POC NONE SEEN NONE SEEN   Trich, Wet Prep NONE SEEN NONE SEEN   Clue Cells Wet Prep HPF POC FEW (A) NONE SEEN   WBC, Wet Prep HPF POC FEW (A) NONE SEEN     Assessment and Plan  A: UTI and BV P: Discharge Home RX: Flagyl 500mg  BID x7d and Cipro 500mg  BID x 5d F/U pending GC, HIV and Syphilis Results F/U with [facility] or GYN of choice Return if change in vaginal discharge, fever/chills  Micker Samios, FNP-S  I have seen and evaluated the patient with the NP/PA/Med student. I agree with the assessment and plan as written above.   Marny LowensteinJulie N Samika Vetsch, PA-C  04/30/2014 2:02 AM

## 2014-04-30 NOTE — MAU Note (Signed)
Pt states her boyfriend has been sleeping with someone else and pt has been unprotected

## 2014-04-30 NOTE — Discharge Instructions (Signed)
Bacterial Vaginosis °Bacterial vaginosis is a vaginal infection that occurs when the normal balance of bacteria in the vagina is disrupted. It results from an overgrowth of certain bacteria. This is the most common vaginal infection in women of childbearing age. Treatment is important to prevent complications, especially in pregnant women, as it can cause a premature delivery. °CAUSES  °Bacterial vaginosis is caused by an increase in harmful bacteria that are normally present in smaller amounts in the vagina. Several different kinds of bacteria can cause bacterial vaginosis. However, the reason that the condition develops is not fully understood. °RISK FACTORS °Certain activities or behaviors can put you at an increased risk of developing bacterial vaginosis, including: °· Having a new sex partner or multiple sex partners. °· Douching. °· Using an intrauterine device (IUD) for contraception. °Women do not get bacterial vaginosis from toilet seats, bedding, swimming pools, or contact with objects around them. °SIGNS AND SYMPTOMS  °Some women with bacterial vaginosis have no signs or symptoms. Common symptoms include: °· Grey vaginal discharge. °· A fishlike odor with discharge, especially after sexual intercourse. °· Itching or burning of the vagina and vulva. °· Burning or pain with urination. °DIAGNOSIS  °Your health care provider will take a medical history and examine the vagina for signs of bacterial vaginosis. A sample of vaginal fluid may be taken. Your health care provider will look at this sample under a microscope to check for bacteria and abnormal cells. A vaginal pH test may also be done.  °TREATMENT  °Bacterial vaginosis may be treated with antibiotic medicines. These may be given in the form of a pill or a vaginal cream. A second round of antibiotics may be prescribed if the condition comes back after treatment.  °HOME CARE INSTRUCTIONS  °· Only take over-the-counter or prescription medicines as  directed by your health care provider. °· If antibiotic medicine was prescribed, take it as directed. Make sure you finish it even if you start to feel better. °· Do not have sex until treatment is completed. °· Tell all sexual partners that you have a vaginal infection. They should see their health care provider and be treated if they have problems, such as a mild rash or itching. °· Practice safe sex by using condoms and only having one sex partner. °SEEK MEDICAL CARE IF:  °· Your symptoms are not improving after 3 days of treatment. °· You have increased discharge or pain. °· You have a fever. °MAKE SURE YOU:  °· Understand these instructions. °· Will watch your condition. °· Will get help right away if you are not doing well or get worse. °FOR MORE INFORMATION  °Centers for Disease Control and Prevention, Division of STD Prevention: www.cdc.gov/std °American Sexual Health Association (ASHA): www.ashastd.org  °Document Released: 03/21/2005 Document Revised: 01/09/2013 Document Reviewed: 10/31/2012 °ExitCare® Patient Information ©2015 [employer]. This information is not intended to replace advice given to you by your health care provider. Make sure you discuss any questions you have with your health care provider. ° °Urinary Tract Infection °A urinary tract infection (UTI) can occur any place along the urinary tract. The tract includes the kidneys, ureters, bladder, and urethra. A type of germ called bacteria often causes a UTI. UTIs are often helped with antibiotic medicine.  °HOME CARE  °· If given, take antibiotics as told by your doctor. Finish them even if you start to feel better. °· Drink enough fluids to keep your pee (urine) clear or pale yellow. °· Avoid tea, drinks with caffeine,   and bubbly (carbonated) drinks. °· Pee often. Avoid holding your pee in for a long time. °· Pee before and after having sex (intercourse). °· Wipe from front to back after you poop (bowel movement) if you are a woman. Use  each tissue only once. °GET HELP RIGHT AWAY IF:  °· You have back pain. °· You have lower belly (abdominal) pain. °· You have chills. °· You feel sick to your stomach (nauseous). °· You throw up (vomit). °· Your burning or discomfort with peeing does not go away. °· You have a fever. °· Your symptoms are not better in 3 days. °MAKE SURE YOU:  °· Understand these instructions. °· Will watch your condition. °· Will get help right away if you are not doing well or get worse. °Document Released: 09/07/2007 Document Revised: 12/14/2011 Document Reviewed: 10/20/2011 °ExitCare® Patient Information ©2015 [employer]. This information is not intended to replace advice given to you by your health care provider. Make sure you discuss any questions you have with your health care provider. ° °

## 2014-04-30 NOTE — MAU Note (Signed)
PT SAYS  HER BOYFRIEND  OF 15 YEARS  TOLD HER YESTERDAY  THAT HE WENT  TO  DR  AND TOLD HE HAS  BACTERIAL INFECTION-  HE HAS  ANOTHER  PARTNER.   PT  SAYS BTL- 03-2007.   NOT   USING   COMDOMS       SHE HAS LOWER ABD PAIN-  STARTED  LAST WEEK.

## 2014-05-02 ENCOUNTER — Telehealth (HOSPITAL_COMMUNITY): Payer: Self-pay | Admitting: Advanced Practice Midwife

## 2014-05-02 DIAGNOSIS — A749 Chlamydial infection, unspecified: Secondary | ICD-10-CM

## 2014-05-02 DIAGNOSIS — O98812 Other maternal infectious and parasitic diseases complicating pregnancy, second trimester: Principal | ICD-10-CM

## 2014-05-02 LAB — RPR: RPR Ser Ql: NONREACTIVE

## 2014-05-02 LAB — HIV ANTIBODY (ROUTINE TESTING W REFLEX): HIV Screen 4th Generation wRfx: NONREACTIVE

## 2014-05-02 MED ORDER — AZITHROMYCIN 500 MG PO TABS: ORAL_TABLET | ORAL | Status: DC

## 2014-05-02 MED ORDER — FLUCONAZOLE 150 MG PO TABS: 150.0000 mg | ORAL_TABLET | Freq: Every day | ORAL | Status: AC

## 2014-05-02 NOTE — Telephone Encounter (Signed)
Telephone call to patient regarding positive chlamydia culture, patient notified.  Patient has not been treated and Rx called in per protocol  Patient also requested Diflucan as she always gets a yeast infection with antibiotics.  Verbal order from Collene GobbleLisa Leftwich -Craige CottaKirby, CNM for diflucan.  Instructed patient to notify her partner for treatment.  Report faxed to health department.

## 2014-05-03 LAB — URINE CULTURE: Colony Count: >=100000

## 2014-10-25 ENCOUNTER — Emergency Department (HOSPITAL_COMMUNITY)
Admission: EM | Admit: 2014-10-25 | Discharge: 2014-10-26 | Disposition: A | Discharge status: Home / Self Care | Payer: No Typology Code available for payment source | Attending: Emergency Medicine | Admitting: Emergency Medicine

## 2014-10-25 ENCOUNTER — Encounter (HOSPITAL_COMMUNITY): Payer: Self-pay | Admitting: Emergency Medicine

## 2014-10-25 DIAGNOSIS — Z87891 Personal history of nicotine dependence: Secondary | ICD-10-CM | POA: Diagnosis not present

## 2014-10-25 DIAGNOSIS — R21 Rash and other nonspecific skin eruption: Secondary | ICD-10-CM | POA: Insufficient documentation

## 2014-10-25 DIAGNOSIS — M329 Systemic lupus erythematosus, unspecified: Secondary | ICD-10-CM

## 2014-10-25 DIAGNOSIS — Z8739 Personal history of other diseases of the musculoskeletal system and connective tissue: Secondary | ICD-10-CM | POA: Insufficient documentation

## 2014-10-25 DIAGNOSIS — R1010 Upper abdominal pain, unspecified: Secondary | ICD-10-CM | POA: Diagnosis present

## 2014-10-25 DIAGNOSIS — N390 Urinary tract infection, site not specified: Secondary | ICD-10-CM | POA: Diagnosis not present

## 2014-10-25 DIAGNOSIS — K5900 Constipation, unspecified: Secondary | ICD-10-CM | POA: Insufficient documentation

## 2014-10-25 DIAGNOSIS — Z3202 Encounter for pregnancy test, result negative: Secondary | ICD-10-CM | POA: Insufficient documentation

## 2014-10-25 NOTE — ED Notes (Signed)
Pt from home c/o burning rash to right side of face x years but has burning has never occurred. Until now. Hx of lupus.

## 2014-10-26 LAB — CBC WITH DIFFERENTIAL/PLATELET
Basophils Absolute: 0 10*3/uL (ref 0.0–0.1)
Basophils Relative: 0 % (ref 0–1)
Eosinophils Absolute: 0.1 10*3/uL (ref 0.0–0.7)
Eosinophils Relative: 3 % (ref 0–5)
HCT: 35.2 % — ABNORMAL LOW (ref 36.0–46.0)
Hemoglobin: 10.9 g/dL — ABNORMAL LOW (ref 12.0–15.0)
Lymphocytes Relative: 49 % — ABNORMAL HIGH (ref 12–46)
Lymphs Abs: 2.4 10*3/uL (ref 0.7–4.0)
MCH: 20.2 pg — ABNORMAL LOW (ref 26.0–34.0)
MCHC: 31 g/dL (ref 30.0–36.0)
MCV: 65.2 fL — ABNORMAL LOW (ref 78.0–100.0)
Monocytes Absolute: 0.4 10*3/uL (ref 0.1–1.0)
Monocytes Relative: 9 % (ref 3–12)
Neutro Abs: 1.9 10*3/uL (ref 1.7–7.7)
Neutrophils Relative %: 39 % — ABNORMAL LOW (ref 43–77)
Platelets: 204 10*3/uL (ref 150–400)
RBC: 5.4 MIL/uL — ABNORMAL HIGH (ref 3.87–5.11)
RDW: 17.6 % — ABNORMAL HIGH (ref 11.5–15.5)
WBC: 4.8 10*3/uL (ref 4.0–10.5)

## 2014-10-26 LAB — COMPREHENSIVE METABOLIC PANEL
ALT: 14 U/L (ref 14–54)
AST: 19 U/L (ref 15–41)
Albumin: 3.6 g/dL (ref 3.5–5.0)
Alkaline Phosphatase: 36 U/L — ABNORMAL LOW (ref 38–126)
Anion gap: 7 (ref 5–15)
BUN: 8 mg/dL (ref 6–20)
CO2: 24 mmol/L (ref 22–32)
Calcium: 8.7 mg/dL — ABNORMAL LOW (ref 8.9–10.3)
Chloride: 104 mmol/L (ref 101–111)
Creatinine, Ser: 0.47 mg/dL (ref 0.44–1.00)
GFR calc Af Amer: >60 mL/min (ref >60)
GFR calc non Af Amer: >60 mL/min (ref >60)
Glucose, Bld: 125 mg/dL — ABNORMAL HIGH (ref 65–99)
Potassium: 3.5 mmol/L (ref 3.5–5.1)
Sodium: 135 mmol/L (ref 135–145)
Total Bilirubin: 0.3 mg/dL (ref 0.3–1.2)
Total Protein: 7.8 g/dL (ref 6.5–8.1)

## 2014-10-26 LAB — URINALYSIS, ROUTINE W REFLEX MICROSCOPIC
Bilirubin Urine: NEGATIVE
Glucose, UA: NEGATIVE mg/dL
Hgb urine dipstick: NEGATIVE
Ketones, ur: NEGATIVE mg/dL
Nitrite: POSITIVE — AB
Protein, ur: NEGATIVE mg/dL
Specific Gravity, Urine: 1.02 (ref 1.005–1.030)
Urobilinogen, UA: 0.2 mg/dL (ref 0.0–1.0)
pH: 7 (ref 5.0–8.0)

## 2014-10-26 LAB — POC URINE PREG, ED: Preg Test, Ur: NEGATIVE

## 2014-10-26 LAB — URINE MICROSCOPIC-ADD ON

## 2014-10-26 LAB — LIPASE, BLOOD: Lipase: 32 U/L (ref 22–51)

## 2014-10-26 MED ORDER — CEPHALEXIN 500 MG PO CAPS: 500.0000 mg | ORAL_CAPSULE | Freq: Four times a day (QID) | ORAL | Status: DC

## 2014-10-26 MED ORDER — HYDROCODONE-ACETAMINOPHEN 5-325 MG PO TABS: 1.0000 | ORAL_TABLET | ORAL | Status: DC | PRN

## 2014-10-26 NOTE — ED Provider Notes (Signed)
CSN: [id number]     Arrival date & time 10/25/14  2317 History   First MD Initiated Contact with Patient 10/26/14 [phone]     Chief Complaint  Patient presents with  . Rash  . Lupus     (Consider location/radiation/quality/duration/timing/severity/associated sxs/prior Treatment) HPI Comments: Patient with h/o lupus and facial rash now has burning facial and scalp rash for 2 days. She also has upper abdominal waxing and waning pain associated with constipation. No nausea or vomiting. She denies any fever, congestion, cough. She reports urinary frequency with dysuria. No hematuria.   Patient is a 34 y.o. female presenting with rash. The history is provided by the patient. No language interpreter was used.  Rash Associated symptoms: abdominal pain   Associated symptoms: no fever, no nausea, no shortness of breath and not vomiting     Past Medical History  Diagnosis Date  . Lupus    Past Surgical History  Procedure Laterality Date  . Tubal ligation     No family history on file. History  Substance Use Topics  . Smoking status: Former Smoker -- 0.25 packs/day  . Smokeless tobacco: Never Used  . Alcohol Use: No     Comment: NONE  X6 MTHS   OB History    Gravida Para Term Preterm AB TAB SAB Ectopic Multiple Living   4    1  1   3      Review of Systems  Constitutional: Negative for fever and chills.  HENT: Negative.  Negative for congestion.   Respiratory: Negative.  Negative for cough and shortness of breath.   Cardiovascular: Negative.  Negative for chest pain.  Gastrointestinal: Positive for abdominal pain. Negative for nausea and vomiting.  Genitourinary: Positive for dysuria and frequency.  Musculoskeletal: Negative.   Skin: Positive for rash.  Neurological: Negative.       Allergies  Review of patient's allergies indicates no known allergies.  Home Medications   Prior to Admission medications   Medication Sig Start Date End Date Taking? Authorizing Provider   acetaminophen (TYLENOL) 500 MG tablet Take 500 mg by mouth once.   Yes Historical Provider, MD  albuterol (PROVENTIL) (5 MG/ML) 0.5% nebulizer solution Take 1 mL (5 mg total) by nebulization every 4 (four) hours as needed for wheezing or shortness of breath. Patient not taking: Reported on 10/26/2014 03/29/13   Garlon Hatchet, PA-C  azithromycin [facility]) 500 MG tablet Take two tablets Patient not taking: Reported on 10/26/2014 05/02/14   Wilmer Floor Leftwich-Kirby, CNM  ciprofloxacin (CIPRO) 250 MG tablet Take 1 tablet (250 mg total) by mouth every 12 (twelve) hours. Patient not taking: Reported on 10/26/2014 04/30/14   Marny Lowenstein, PA-C  HYDROcodone-acetaminophen (HYCET) 7.5-325 mg/15 ml solution Take 10 mLs by mouth every 6 (six) hours as needed (for cough or pain). Patient not taking: Reported on 10/26/2014 03/29/13   Paula Libra, MD  metroNIDAZOLE (FLAGYL) 500 MG tablet Take 1 tablet (500 mg total) by mouth 2 (two) times daily. Patient not taking: Reported on 10/26/2014 04/30/14   Marny Lowenstein, PA-C  Nebulizers MISC Use with albuterol solution Q4H PRN wheezing Patient not taking: Reported on 10/26/2014 03/29/13   Garlon Hatchet, PA-C   BP 134/76 mmHg  Pulse 76  Temp(Src) 98.4 F (36.9 C) (Oral)  Resp 18  SpO2 100%  LMP 10/02/2014 Physical Exam  Constitutional: She is oriented to person, place, and time. She appears well-developed and well-nourished.  HENT:  Head: Normocephalic.  Neck: Normal range of  motion. Neck supple.  Cardiovascular: Normal rate and regular rhythm.   Pulmonary/Chest: Effort normal and breath sounds normal.  Abdominal: Soft. Bowel sounds are normal. She exhibits no distension. There is tenderness. There is no rebound and no guarding.  Bilateral upper abdominal tenderness to soft abdomen.   Musculoskeletal: Normal range of motion.  Neurological: She is alert and oriented to person, place, and time.  Skin: Skin is warm and dry. No rash noted.  Scaling, mixed pigment  well demarcated rash right cheek and frontal scalp.   Psychiatric: She has a normal mood and affect.    ED Course  Procedures (including critical care time) Labs Review Labs Reviewed  URINALYSIS, ROUTINE W REFLEX MICROSCOPIC (NOT AT [facility])  POC URINE PREG, ED    Imaging Review No results found.   EKG Interpretation None      MDM   Final diagnoses:  None    1. UTI 2. H/o lupus 3. Malar rash  The patient has not required any medications in the ED. VSS. Evidence of UTI without renal compromise. She is felt stable for discharge home.    Elpidio Anis, PA-C 10/26/14 0541  April Palumbo, MD 10/26/14 [phone]

## 2014-10-26 NOTE — ED Notes (Signed)
Pt from home states she has a hx of Lupus and has a rash on her face and head; both of which have been present for "years". Pt states on Friday, the rash on her face began burning "like a lighter was up against it". Pt also has complaints of abdominal pain that she describes as "contractions" at the top of her abdomen and in her groin. She has had  A tubal ligation. She states that this happened once before and the last time it was a UTI

## 2014-10-26 NOTE — Discharge Instructions (Signed)

## 2016-03-26 ENCOUNTER — Emergency Department (HOSPITAL_COMMUNITY)
Admission: EM | Admit: 2016-03-26 | Discharge: 2016-03-26 | Discharge status: Against Medical Advice | Payer: No Typology Code available for payment source

## 2016-03-26 NOTE — ED Notes (Signed)
Pt came to nurse first, advised she is wanting to go to urgent care and just realized they will open soon.

## 2017-01-10 ENCOUNTER — Ambulatory Visit: Payer: Self-pay | Admitting: Obstetrics

## 2017-05-10 ENCOUNTER — Other Ambulatory Visit: Payer: Self-pay

## 2017-05-10 ENCOUNTER — Emergency Department (HOSPITAL_COMMUNITY)
Admission: EM | Admit: 2017-05-10 | Discharge: 2017-05-10 | Discharge status: Against Medical Advice | Payer: No Typology Code available for payment source

## 2017-05-10 NOTE — ED Notes (Signed)
Pt called x2 to be triaged with no response,. RN notified.

## 2017-05-10 NOTE — ED Notes (Signed)
Pt called for triage multiple times

## 2019-10-15 ENCOUNTER — Emergency Department (HOSPITAL_COMMUNITY)
Admission: EM | Admit: 2019-10-15 | Discharge: 2019-10-16 | Disposition: A | Discharge status: Home / Self Care | Payer: No Typology Code available for payment source | Attending: Emergency Medicine | Admitting: Emergency Medicine

## 2019-10-15 DIAGNOSIS — R519 Headache, unspecified: Secondary | ICD-10-CM | POA: Diagnosis not present

## 2019-10-15 DIAGNOSIS — Z5321 Procedure and treatment not carried out due to patient leaving prior to being seen by health care provider: Secondary | ICD-10-CM | POA: Diagnosis not present

## 2019-10-15 DIAGNOSIS — M329 Systemic lupus erythematosus, unspecified: Secondary | ICD-10-CM | POA: Diagnosis not present

## 2019-10-15 DIAGNOSIS — R110 Nausea: Secondary | ICD-10-CM | POA: Diagnosis not present

## 2019-10-15 DIAGNOSIS — J029 Acute pharyngitis, unspecified: Secondary | ICD-10-CM | POA: Insufficient documentation

## 2019-10-16 ENCOUNTER — Encounter (HOSPITAL_COMMUNITY): Payer: Self-pay

## 2019-10-16 ENCOUNTER — Other Ambulatory Visit: Payer: Self-pay

## 2019-10-16 NOTE — ED Triage Notes (Signed)
Patient arrived stating she has lupus and has had a headache and throat pain over the last week. Reports some nausea.

## 2020-10-30 ENCOUNTER — Other Ambulatory Visit: Payer: Self-pay

## 2020-10-30 ENCOUNTER — Ambulatory Visit (HOSPITAL_COMMUNITY)
Admission: EM | Admit: 2020-10-30 | Discharge: 2020-10-30 | Disposition: A | Discharge status: Home / Self Care | Payer: Medicaid Other | Attending: Student | Admitting: Student

## 2020-10-30 ENCOUNTER — Encounter (HOSPITAL_COMMUNITY): Payer: Self-pay | Admitting: Emergency Medicine

## 2020-10-30 DIAGNOSIS — R590 Localized enlarged lymph nodes: Secondary | ICD-10-CM | POA: Insufficient documentation

## 2020-10-30 MED ORDER — PREDNISONE 20 MG PO TABS: 40.0000 mg | ORAL_TABLET | Freq: Every day | ORAL | 0 refills | Status: AC

## 2020-10-30 NOTE — ED Triage Notes (Signed)
Pt presents with neck pain/ swelling on right side xs 3 weeks. States pain has been worse over last 3 days. States aleve and tylenol gives no relief.

## 2020-10-30 NOTE — ED Provider Notes (Signed)
[facility]    CSN: [id number] Arrival date & time: 10/30/20  1941      History   Chief Complaint Chief Complaint  Patient presents with   Torticollis    HPI [name] is a 40 y.o. female presenting with R sided cervical lymphadenopathy x3 weeks. History lupus and iron deficiency anemia. Painful LNs R neck. Somewhat relieved by ibuprofen or tylenol but then symptoms return. Denies recent URI, fevers/chills, night sweats, weight loss. Not followed by PCP.   HPI  Past Medical History:  Diagnosis Date   Lupus (HCC)     There are no problems to display for this patient.   Past Surgical History:  Procedure Laterality Date   TUBAL LIGATION      OB History     Gravida  4   Para      Term      Preterm      AB  1   Living  3      SAB  1   IAB      Ectopic      Multiple      Live Births               Home Medications    Prior to Admission medications   Medication Sig Start Date End Date Taking? Authorizing Provider  predniSONE (DELTASONE) 20 MG tablet Take 2 tablets (40 mg total) by mouth daily for 5 days. 10/30/20 11/04/20 Yes Rhys Martini, PA-C  acetaminophen (TYLENOL) 500 MG tablet Take 500 mg by mouth once.    [provider]  albuterol (PROVENTIL) (5 MG/ML) 0.5% nebulizer solution Take 1 mL (5 mg total) by nebulization every 4 (four) hours as needed for wheezing or shortness of breath. Patient not taking: Reported on 10/26/2014 03/29/13   Garlon Hatchet, PA-C  azithromycin [facility]) 500 MG tablet Take two tablets Patient not taking: Reported on 10/26/2014 05/02/14   Hurshel Party, CNM  cephALEXin (KEFLEX) 500 MG capsule Take 1 capsule (500 mg total) by mouth 4 (four) times daily. 10/26/14   Elpidio Anis, PA-C  ciprofloxacin (CIPRO) 250 MG tablet Take 1 tablet (250 mg total) by mouth every 12 (twelve) hours. Patient not taking: Reported on 10/26/2014 04/30/14   Marny Lowenstein, PA-C  HYDROcodone-acetaminophen  (NORCO/VICODIN) 5-325 MG per tablet Take 1-2 tablets by mouth every 4 (four) hours as needed. 10/26/14   Elpidio Anis, PA-C  metroNIDAZOLE (FLAGYL) 500 MG tablet Take 1 tablet (500 mg total) by mouth 2 (two) times daily. Patient not taking: Reported on 10/26/2014 04/30/14   Marny Lowenstein, PA-C  Nebulizers MISC Use with albuterol solution Q4H PRN wheezing Patient not taking: Reported on 10/26/2014 03/29/13   Garlon Hatchet, PA-C    Family History History reviewed. No pertinent family history.  Social History Social History   Tobacco Use   Smoking status: Former    Packs/day: 0.25    Types: Cigarettes   Smokeless tobacco: Never  Substance Use Topics   Alcohol use: No    Comment: NONE  X6 MTHS   Drug use: No     Allergies   Patient has no known allergies.   Review of Systems Review of Systems  Constitutional:  Negative for appetite change, chills, fatigue and fever.  HENT:  Negative for congestion, sinus pressure, sore throat, trouble swallowing and voice change.   Eyes:  Negative for photophobia, pain, discharge, redness, itching and visual disturbance.  Respiratory:  Negative for cough,  chest tightness and shortness of breath.   Cardiovascular:  Negative for chest pain, palpitations and leg swelling.  Gastrointestinal:  Negative for abdominal pain, constipation, diarrhea, nausea and vomiting.  Genitourinary:  Negative for dysuria, flank pain, frequency and urgency.  Musculoskeletal:  Negative for back pain, gait problem, myalgias, neck pain and neck stiffness.  Neurological:  Negative for dizziness, tremors, seizures, syncope, facial asymmetry, speech difficulty, weakness, light-headedness, numbness and headaches.  Psychiatric/Behavioral:  Negative for agitation, decreased concentration, dysphoric mood, hallucinations and suicidal ideas. The patient is not nervous/anxious.   All other systems reviewed and are negative.   Physical Exam Triage Vital Signs ED Triage Vitals   Enc Vitals Group     BP 10/30/20 2003 129/78     Pulse Rate 10/30/20 2003 (!) 103     Resp 10/30/20 2003 17     Temp 10/30/20 2003 98.3 F (36.8 C)     Temp Source 10/30/20 2003 Oral     SpO2 10/30/20 2003 100 %     Weight --      Height --      Head Circumference --      Peak Flow --      Pain Score 10/30/20 2001 8     Pain Loc --      Pain Edu? --      Excl. in GC? --    No data found.  Updated Vital Signs BP 129/78 (BP Location: Left Arm)   Pulse (!) 103   Temp 98.3 F (36.8 C) (Oral)   Resp 17   LMP 10/21/2020   SpO2 100%   Visual Acuity Right Eye Distance:   Left Eye Distance:   Bilateral Distance:    Right Eye Near:   Left Eye Near:    Bilateral Near:     Physical Exam Vitals reviewed.  Constitutional:      General: She is not in acute distress.    Appearance: Normal appearance. She is not ill-appearing or diaphoretic.  HENT:     Head: Normocephalic and atraumatic.     Right Ear: Tympanic membrane, ear canal and external ear normal. No tenderness. No middle ear effusion. There is no impacted cerumen. Tympanic membrane is not perforated, erythematous, retracted or bulging.     Left Ear: Tympanic membrane, ear canal and external ear normal. No tenderness.  No middle ear effusion. There is no impacted cerumen. Tympanic membrane is not perforated, erythematous, retracted or bulging.     Nose: Nose normal. No congestion.     Mouth/Throat:     Mouth: Mucous membranes are moist.     Pharynx: Uvula midline. No oropharyngeal exudate or posterior oropharyngeal erythema.  Eyes:     Extraocular Movements: Extraocular movements intact.     Pupils: Pupils are equal, round, and reactive to light.  Neck:     Comments: Lymphadenopathy along R cervical chain, not mobile, tender.  Cardiovascular:     Rate and Rhythm: Normal rate and regular rhythm.     Heart sounds: Normal heart sounds.  Pulmonary:     Effort: Pulmonary effort is normal.     Breath sounds: Normal  breath sounds. No decreased breath sounds, wheezing, rhonchi or rales.  Abdominal:     Palpations: Abdomen is soft.     Tenderness: There is no abdominal tenderness. There is no guarding or rebound.  Lymphadenopathy:     Cervical: Cervical adenopathy present.     Right cervical: Superficial cervical adenopathy present.  Skin:  General: Skin is warm.  Neurological:     General: No focal deficit present.     Mental Status: She is alert and oriented to person, place, and time.  Psychiatric:        Mood and Affect: Mood normal.        Behavior: Behavior normal.        Thought Content: Thought content normal.        Judgment: Judgment normal.     UC Treatments / Results  Labs (all labs ordered are listed, but only abnormal results are displayed) Labs Reviewed  CBC WITH DIFFERENTIAL/PLATELET    EKG   Radiology No results found.  Procedures Procedures (including critical care time)  Medications Ordered in UC Medications - No data to display  Initial Impression / Assessment and Plan / UC Course  I have reviewed the triage vital signs and the nursing notes.  Pertinent labs & imaging results that were available during my care of the patient were reviewed by me and considered in my medical decision making (see chart for details).     This patient is a very pleasant 40 y.o. year old female presenting with R cervical lymphadenopathy x3 weeks. Denies weight loss, fevers/chills, nightsweats, etc. Checked a CBC w diff, which showed significant iron deficiency anemia of 5.6. Normal WBC. Called patient to deliver results; advised she head straight to ED for blood transfusion and further work-up. Patient verbalizes understanding and agreement.   PCP referral placed Prednisone as below  ED return precautions discussed. Patient verbalizes understanding and agreement.   Coding as Level 4 due to ED management.  Final Clinical Impressions(s) / UC Diagnoses   Final diagnoses:   Lymphadenopathy of right cervical region     Discharge Instructions      -Prednisone, 2 pills taken at the same time for 5 days in a row.  Try taking this earlier in the day as it can give you energy. Avoid NSAIDs like ibuprofen and alleve while taking this medication as they can increase your risk of stomach upset and even GI bleeding when in combination with a steroid. You can continue tylenol (acetaminophen) up to 1000mg  3x daily. -I sent a referral for a primary care provider, please establish care -We will call you if the blood work shows infection -If symptoms are getting worse and you cannot see your primary care provider, like neck pain, neck swelling, new symptoms like weight loss-head to the emergency department.     ED Prescriptions     Medication Sig Dispense Auth. Provider   predniSONE (DELTASONE) 20 MG tablet Take 2 tablets (40 mg total) by mouth daily for 5 days. 10 tablet , PA-C      PDMP not reviewed this encounter.   Rhys Martini, PA-C 10/31/20 1021

## 2020-10-30 NOTE — Discharge Instructions (Addendum)
-  Prednisone, 2 pills taken at the same time for 5 days in a row.  Try taking this earlier in the day as it can give you energy. Avoid NSAIDs like ibuprofen and alleve while taking this medication as they can increase your risk of stomach upset and even GI bleeding when in combination with a steroid. You can continue tylenol (acetaminophen) up to 1000mg  3x daily. -I sent a referral for a primary care provider, please establish care -We will call you if the blood work shows infection -If symptoms are getting worse and you cannot see your primary care provider, like neck pain, neck swelling, new symptoms like weight loss-head to the emergency department.

## 2020-10-31 ENCOUNTER — Emergency Department (HOSPITAL_COMMUNITY)
Admission: EM | Admit: 2020-10-31 | Discharge: 2020-10-31 | Disposition: A | Discharge status: Home / Self Care | Payer: Medicaid Other | Attending: Emergency Medicine | Admitting: Emergency Medicine

## 2020-10-31 ENCOUNTER — Other Ambulatory Visit: Payer: Self-pay

## 2020-10-31 ENCOUNTER — Encounter (HOSPITAL_COMMUNITY): Payer: Self-pay | Admitting: Emergency Medicine

## 2020-10-31 ENCOUNTER — Telehealth (HOSPITAL_COMMUNITY): Payer: Self-pay | Admitting: Student

## 2020-10-31 DIAGNOSIS — M542 Cervicalgia: Secondary | ICD-10-CM | POA: Insufficient documentation

## 2020-10-31 DIAGNOSIS — R42 Dizziness and giddiness: Secondary | ICD-10-CM | POA: Diagnosis present

## 2020-10-31 DIAGNOSIS — Z87891 Personal history of nicotine dependence: Secondary | ICD-10-CM | POA: Insufficient documentation

## 2020-10-31 DIAGNOSIS — D509 Iron deficiency anemia, unspecified: Secondary | ICD-10-CM | POA: Insufficient documentation

## 2020-10-31 LAB — CBC WITH DIFFERENTIAL/PLATELET
Abs Immature Granulocytes: 0.03 10*3/uL (ref 0.00–0.07)
Abs Immature Granulocytes: 0.03 10*3/uL (ref 0.00–0.07)
Basophils Absolute: 0 10*3/uL (ref 0.0–0.1)
Basophils Absolute: 0 10*3/uL (ref 0.0–0.1)
Basophils Relative: 0 %
Basophils Relative: 0 %
Eosinophils Absolute: 0.3 10*3/uL (ref 0.0–0.5)
Eosinophils Absolute: 0.5 10*3/uL (ref 0.0–0.5)
Eosinophils Relative: 4 %
Eosinophils Relative: 6 %
HCT: 22.7 % — ABNORMAL LOW (ref 36.0–46.0)
HCT: 23.4 % — ABNORMAL LOW (ref 36.0–46.0)
Hemoglobin: 5.6 g/dL — CL (ref 12.0–15.0)
Hemoglobin: 5.7 g/dL — CL (ref 12.0–15.0)
Immature Granulocytes: 0 %
Immature Granulocytes: 0 %
Lymphocytes Relative: 29 %
Lymphocytes Relative: 27 %
Lymphs Abs: 2.4 10*3/uL (ref 0.7–4.0)
Lymphs Abs: 2.3 10*3/uL (ref 0.7–4.0)
MCH: 12.8 pg — ABNORMAL LOW (ref 26.0–34.0)
MCH: 12.8 pg — ABNORMAL LOW (ref 26.0–34.0)
MCHC: 24.7 g/dL — ABNORMAL LOW (ref 30.0–36.0)
MCHC: 24.4 g/dL — ABNORMAL LOW (ref 30.0–36.0)
MCV: 51.7 fL — ABNORMAL LOW (ref 80.0–100.0)
MCV: 52.5 fL — ABNORMAL LOW (ref 80.0–100.0)
Monocytes Absolute: 0.5 10*3/uL (ref 0.1–1.0)
Monocytes Absolute: 0.5 10*3/uL (ref 0.1–1.0)
Monocytes Relative: 6 %
Monocytes Relative: 6 %
Neutro Abs: 4.9 10*3/uL (ref 1.7–7.7)
Neutro Abs: 5.1 10*3/uL (ref 1.7–7.7)
Neutrophils Relative %: 61 %
Neutrophils Relative %: 61 %
Platelets: 267 10*3/uL (ref 150–400)
Platelets: 260 10*3/uL (ref 150–400)
RBC: 4.39 MIL/uL (ref 3.87–5.11)
RBC: 4.46 MIL/uL (ref 3.87–5.11)
RDW: 25.2 % — ABNORMAL HIGH (ref 11.5–15.5)
RDW: 25.2 % — ABNORMAL HIGH (ref 11.5–15.5)
WBC: 8.1 10*3/uL (ref 4.0–10.5)
WBC: 8.4 10*3/uL (ref 4.0–10.5)
nRBC: 0.2 % (ref 0.0–0.2)
nRBC: 0.2 % (ref 0.0–0.2)

## 2020-10-31 LAB — PREPARE RBC (CROSSMATCH)

## 2020-10-31 LAB — BASIC METABOLIC PANEL
Anion gap: 9 (ref 5–15)
BUN: <5 mg/dL — ABNORMAL LOW (ref 6–20)
CO2: 25 mmol/L (ref 22–32)
Calcium: 8.7 mg/dL — ABNORMAL LOW (ref 8.9–10.3)
Chloride: 99 mmol/L (ref 98–111)
Creatinine, Ser: 0.5 mg/dL (ref 0.44–1.00)
GFR, Estimated: >60 mL/min (ref >60)
Glucose, Bld: 131 mg/dL — ABNORMAL HIGH (ref 70–99)
Potassium: 3.3 mmol/L — ABNORMAL LOW (ref 3.5–5.1)
Sodium: 133 mmol/L — ABNORMAL LOW (ref 135–145)

## 2020-10-31 LAB — I-STAT BETA HCG BLOOD, ED (MC, WL, AP ONLY): I-stat hCG, quantitative: <5 m[IU]/mL (ref <5)

## 2020-10-31 MED ORDER — KETOROLAC TROMETHAMINE 30 MG/ML IJ SOLN
30.0000 mg | Freq: Once | INTRAMUSCULAR | Status: AC
  Administered 2020-10-31: 30 mg via INTRAVENOUS
  Filled 2020-10-31: qty 1

## 2020-10-31 MED ORDER — DOCUSATE SODIUM 100 MG PO CAPS: 100.0000 mg | ORAL_CAPSULE | Freq: Every day | ORAL | 3 refills | Status: AC

## 2020-10-31 MED ORDER — SODIUM CHLORIDE 0.9 % IV SOLN
10.0000 mL/h | Freq: Once | INTRAVENOUS | Status: AC
Start: 2020-10-31 — End: 2020-10-31
  Administered 2020-10-31: 10 mL/h via INTRAVENOUS

## 2020-10-31 MED ORDER — FERROUS SULFATE 325 (65 FE) MG PO TABS: 325.0000 mg | ORAL_TABLET | Freq: Every day | ORAL | 3 refills | Status: AC

## 2020-10-31 NOTE — Discharge Instructions (Addendum)
You are anemic today (hgb 5.7).  This may be due to your heavy periods.  We gave you 2 blood transfusions in the ER today.  I would recommend the next time your menstrual period starts, you start taking ibuprofen (advil) 600 mg every 8 hours for the duration of your period.  I would also recommend calling to establish care with an OB/GYN provider for your irregular periods.  There are other options they can provide you in terms of medication to manage this.  I started you on iron pills which is take every day to help with making more blood.  I also gave you a stool softener because iron can constipate people.  It is extremely important that you have a doctor recheck your hemoglobin level this week.  We need to make sure that it is staying stable and improved.

## 2020-10-31 NOTE — ED Provider Notes (Signed)
[facility] EMERGENCY DEPARTMENT Provider Note   CSN: [id number] Arrival date & time: 10/31/20  1419     History CC: Low hemoglobin   [name] is a 40 y.o. female with history of lupus present emergency department with anemia.  The patient reports that her PCP noted that she was anemic on her blood test yesterday.  She reports she was seen the PCP because he was having pain in her neck.  She does report a history of heavy menstrual periods.  Her periods are regular.  2 months ago she had a menstrual period lasting about 5 days which she said was extremely heavy, soaking through about a pad an hour.  She does not use birth control.  She is not on iron tablets.  She reports that she feels chronically fatigued and lightheaded, no acute changes in the past several days.  She has never had a blood transfusion before.  HPI     Past Medical History:  Diagnosis Date   Lupus (HCC)     There are no problems to display for this patient.   Past Surgical History:  Procedure Laterality Date   TUBAL LIGATION       OB History     Gravida  4   Para      Term      Preterm      AB  1   Living  3      SAB  1   IAB      Ectopic      Multiple      Live Births              No family history on file.  Social History   Tobacco Use   Smoking status: Former    Packs/day: 0.25    Types: Cigarettes   Smokeless tobacco: Never  Substance Use Topics   Alcohol use: No    Comment: NONE  X6 MTHS   Drug use: No    Home Medications Prior to Admission medications   Medication Sig Start Date End Date Taking? Authorizing Provider  acetaminophen (TYLENOL) 500 MG tablet Take 1,000 mg by mouth every 6 (six) hours as needed for mild pain or headache.   Yes [provider]  docusate sodium (COLACE) 100 MG capsule Take 1 capsule (100 mg total) by mouth daily. 10/31/20 11/30/20 Yes Ivey Nembhard, Kermit Balo, MD  ferrous sulfate 325 (65 FE) MG tablet Take 1  tablet (325 mg total) by mouth daily. 10/31/20 11/30/20 Yes Smokey Melott, Kermit Balo, MD  ibuprofen (ADVIL) 200 MG tablet Take 400 mg by mouth every 6 (six) hours as needed for headache or mild pain.   Yes [provider]  naproxen sodium (ALEVE) 220 MG tablet Take 660 mg by mouth 2 (two) times daily as needed (pain).   Yes [provider]  predniSONE (DELTASONE) 20 MG tablet Take 2 tablets (40 mg total) by mouth daily for 5 days. Patient not taking: Reported on 10/31/2020 10/30/20 11/04/20  Rhys Martini, PA-C    Allergies    Patient has no known allergies.  Review of Systems   Review of Systems  Constitutional:  Negative for chills and fever.  Eyes:  Negative for pain and visual disturbance.  Respiratory:  Positive for shortness of breath. Negative for cough.   Cardiovascular:  Negative for chest pain and palpitations.  Gastrointestinal:  Negative for abdominal pain and vomiting.  Genitourinary:  Positive for menstrual problem. Negative for  dysuria and hematuria.  Musculoskeletal:  Negative for arthralgias and back pain.  Skin:  Negative for color change and rash.  Neurological:  Positive for light-headedness and headaches. Negative for syncope.  All other systems reviewed and are negative.  Physical Exam Updated Vital Signs BP 109/79   Pulse 86   Temp 98.7 F (37.1 C)   Resp 18   LMP 10/21/2020   SpO2 99%   Physical Exam Constitutional:      General: She is not in acute distress. HENT:     Head: Normocephalic and atraumatic.  Eyes:     Pupils: Pupils are equal, round, and reactive to light.     Comments: Conjunctival pallor  Cardiovascular:     Rate and Rhythm: Normal rate and regular rhythm.     Pulses: Normal pulses.  Pulmonary:     Effort: Pulmonary effort is normal. No respiratory distress.  Abdominal:     General: There is no distension.     Tenderness: There is no abdominal tenderness.  Skin:    General: Skin is warm and dry.  Neurological:      General: No focal deficit present.     Mental Status: She is alert and oriented to person, place, and time. Mental status is at baseline.  Psychiatric:        Mood and Affect: Mood normal.        Behavior: Behavior normal.    ED Results / Procedures / Treatments   Labs (all labs ordered are listed, but only abnormal results are displayed) Labs Reviewed  CBC WITH DIFFERENTIAL/PLATELET - Abnormal; Notable for the following components:      Result Value   Hemoglobin 5.7 (*)    HCT 23.4 (*)    MCV 52.5 (*)    MCH 12.8 (*)    MCHC 24.4 (*)    RDW 25.2 (*)    All other components within normal limits  BASIC METABOLIC PANEL - Abnormal; Notable for the following components:   Sodium 133 (*)    Potassium 3.3 (*)    Glucose, Bld 131 (*)    BUN <5 (*)    Calcium 8.7 (*)    All other components within normal limits  I-STAT BETA HCG BLOOD, ED ([facility], [facility], [facility] ONLY)  TYPE AND SCREEN  PREPARE RBC (CROSSMATCH)    EKG None  Radiology No results found.  Procedures .Critical Care  Date/Time: 10/31/2020 8:06 PM Performed by: Terald Sleeper, MD Authorized by: Terald Sleeper, MD   Critical care provider statement:    Critical care time (minutes):  45   Critical care was necessary to treat or prevent imminent or life-threatening deterioration of the following conditions:  Circulatory failure   Critical care was time spent personally by me on the following activities:  Discussions with consultants, evaluation of patient's response to treatment, examination of patient, ordering and performing treatments and interventions, ordering and review of laboratory studies, ordering and review of radiographic studies, pulse oximetry, re-evaluation of patient's condition, obtaining history from patient or surrogate and review of old charts Comments:     Blood transfusions   Medications Ordered in ED Medications  0.9 %  sodium chloride infusion (0 mL/hr Intravenous Stopped 10/31/20 2237)  ketorolac  (TORADOL) 30 MG/ML injection 30 mg (30 mg Intravenous Given 10/31/20 1744)  ketorolac (TORADOL) 30 MG/ML injection 30 mg (30 mg Intravenous Given 10/31/20 2234)    ED Course  I have reviewed the triage vital signs and the nursing notes.  Pertinent labs & imaging results that were available during my care of the patient were reviewed by me and considered in my medical decision making (see chart for details).  Patient is here with anemia found on outpatient labs.  Her hemoglobin is low at 5.3 today.  She is microcytic.  I suspect this is blood loss anemia  from heavy menstrual periods.    Although I do not believe she is acutely symptomatic with anemia, I do think a hemoglobin level this low is quite dangerous.  We discussed the options of blood transfusion in the ED versus beginning on iron tablets and referral for iron transfusions as an outpatient.  She would prefer to have transfusions done here.  I think this is a reasonable plan.  We will give 2 units of blood here and monitor in the ED.  Following the transfusion I think it be reasonable to discharge her on iron at home.  I suspect this is been a gradual and chronic loss.  She does not describe any new or acute symptoms suggest abrupt drop in blood count in the past several days.  Her vital signs are also normal here.  She is well-appearing.  She does have some tenderness along the left SCM which I suspect may be a muscle strain.  No other history or evidence of vascular dissection or injury.  Doubt meningitis.  We can give her a shot of Toradol for this.   Clinical Course as of 10/31/20 2258  Sat Oct 31, 2020  1624 Patient was consented for blood transfusion.  Discussed with her alternative options including iron tablets and iron transfusions, but I do feel the safest plan with this low hemoglobin level would be to transfuse.  She is in agreement.  Her mother is also present at bedside.  We discussed the risk and benefits of transfusions.  We  will give her 2 units of blood at this time.  If her vitals remained stable afterwards, she can be discharged on iron with close PCP follow-up. [MT]  2041 2nd transfusion starting now [MT]    Clinical Course User Index [MT] Terald Sleeper, MD    Final Clinical Impression(s) / ED Diagnoses Final diagnoses:  Iron deficiency anemia, unspecified iron deficiency anemia type    Rx / DC Orders ED Discharge Orders          Ordered    ferrous sulfate 325 (65 FE) MG tablet  Daily        10/31/20 2211    docusate sodium (COLACE) 100 MG capsule  Daily        10/31/20 2211             Terald Sleeper, MD 10/31/20 2259

## 2020-10-31 NOTE — ED Triage Notes (Signed)
Pt states she went to PCP office yesterday for R sided neck pain.  Received a call today to come to ED for a blood transfusion.  States she is always tired and has SOB.

## 2020-10-31 NOTE — Telephone Encounter (Signed)
  This patient is a very pleasant 40 y.o. year old female presenting with R cervical lymphadenopathy x3 weeks. Denies weight loss, fevers/chills, nightsweats, etc. Checked a CBC w diff, which showed significant iron deficiency anemia of 5.6. Normal WBC. Called patient to deliver results; advised she head straight to ED for blood transfusion and further work-up. Patient verbalizes understanding and agreement.

## 2020-10-31 NOTE — ED Provider Notes (Signed)
Emergency Medicine Provider Triage Evaluation Note  [name] , a 40 y.o. female  was evaluated in triage.  Pt complains of anemia. Seen at [facility] yesterday for lymphadenopathy. CBC checked which showed Hgb 5.6. Admits to some abnormal vag bleeding. LMP last week. No heavier than normal however longer cycle. Hx of anemia. Feels fatigued however lightheadedness, dizziness, sob. Does have some tender nodes to right neck which was why she was seen at UC to begin with. No SOB, sore throat   Review of Systems  Positive: anemia Negative: Tenderness, dizziness, chest pain, shortness of breath  Physical Exam  LMP 10/21/2020  Gen:   Awake, no distress   Neck:  Tender non mobile lymph node to right neck Resp:  Normal effort  MSK:   Moves extremities without difficulty  Other:    Medical Decision Making  Medically screening exam initiated at 2:41 PM.  Appropriate orders placed.  [name] was informed that the remainder of the evaluation will be completed by another provider, this initial triage assessment does not replace that evaluation, and the importance of remaining in the ED until their evaluation is complete.  Low hemoglobin     Juston Goheen A, PA-C 10/31/20 1447    Terald Sleeper, MD 11/01/20 [phone]

## 2020-11-01 LAB — TYPE AND SCREEN
ABO/RH(D): AB POS
Antibody Screen: NEGATIVE
Donor AG Type: NEGATIVE
Donor AG Type: NEGATIVE
Unit division: 0
Unit division: 0

## 2020-11-01 LAB — BPAM RBC
Blood Product Expiration Date: 202209022359
Blood Product Expiration Date: 202209032359
ISSUE DATE / TIME: 202207301755
ISSUE DATE / TIME: 202207302039
Unit Type and Rh: 6200
Unit Type and Rh: 6200

## 2020-11-17 ENCOUNTER — Encounter: Payer: Self-pay | Admitting: Family Medicine

## 2020-11-17 ENCOUNTER — Other Ambulatory Visit: Payer: Self-pay

## 2020-11-17 ENCOUNTER — Encounter: Payer: Self-pay | Admitting: *Deleted

## 2020-11-17 ENCOUNTER — Other Ambulatory Visit (HOSPITAL_COMMUNITY)
Admission: RE | Admit: 2020-11-17 | Discharge: 2020-11-17 | Disposition: A | Discharge status: Home / Self Care | Payer: Medicaid Other | Source: Ambulatory Visit | Attending: Family Medicine | Admitting: Family Medicine

## 2020-11-17 ENCOUNTER — Ambulatory Visit (INDEPENDENT_AMBULATORY_CARE_PROVIDER_SITE_OTHER): Payer: Medicaid Other | Admitting: Family Medicine

## 2020-11-17 VITALS — BP 138/86 | Ht 59.0 in | Wt 180.2 lb

## 2020-11-17 DIAGNOSIS — Z1231 Encounter for screening mammogram for malignant neoplasm of breast: Secondary | ICD-10-CM | POA: Diagnosis not present

## 2020-11-17 DIAGNOSIS — Z124 Encounter for screening for malignant neoplasm of cervix: Secondary | ICD-10-CM

## 2020-11-17 DIAGNOSIS — Z01419 Encounter for gynecological examination (general) (routine) without abnormal findings: Secondary | ICD-10-CM

## 2020-11-17 DIAGNOSIS — N939 Abnormal uterine and vaginal bleeding, unspecified: Secondary | ICD-10-CM | POA: Diagnosis not present

## 2020-11-17 NOTE — Progress Notes (Signed)
GYNECOLOGY OFFICE VISIT NOTE  History:   [name] is a 40 y.o. [phone] here today for hospital follow up.  Sent to ED on 10/31/20 for acute anemia with hgb of 5.7 Offered transfusion vs IV iron, elected for transfusion and was given 2u pRBC  Today reports feeling better since the transfusion Now taking iron pills daily No blood in stool No family hx of colon cancer Reports heavy vaginal bleeding for the past several years Has been passing long stringy clots with periods After BTL 13 years ago cycle started to change But particularly heavy in the past few years Period has been irregular for the past several years Sometimes has two cycles in one month  Reports hx of bilateral tubal ligation Three prior cesareans Going to get health insurance through her job very soon within the next month  Health Maintenance Due  Topic Date Due   COVID-19 Vaccine (1) Never done   TETANUS/TDAP  Never done   PAP SMEAR-Modifier  05/19/2001   INFLUENZA VACCINE  11/02/2020    Past Medical History:  Diagnosis Date   Lupus (HCC)     Past Surgical History:  Procedure Laterality Date   TUBAL LIGATION      The following portions of the patient's history were reviewed and updated as appropriate: allergies, current medications, past family history, past medical history, past social history, past surgical history and problem list.   Health Maintenance:   Last pap: No results found for: DIAGPAP, HPV, HPVHIGH  needs  Last mammogram:  Needs    Review of Systems:  Pertinent items noted in HPI and remainder of comprehensive ROS otherwise negative.  Physical Exam:  BP 138/86   Ht 4\' 11"  (1.499 m)   Wt 180 lb 3.2 oz (81.7 kg)   LMP 11/11/2020 (Approximate)   BMI 36.40 kg/m  CONSTITUTIONAL: Well-developed, well-nourished female in no acute distress.  HEENT:  Normocephalic, atraumatic. External right and left ear normal. No scleral icterus.  NECK: Normal range of motion, supple, no  masses noted on observation SKIN: No rash noted. Not diaphoretic. No erythema. No pallor. MUSCULOSKELETAL: Normal range of motion. No edema noted. NEUROLOGIC: Alert and oriented to person, place, and time. Normal muscle tone coordination.  PSYCHIATRIC: Normal mood and affect. Normal behavior. Normal judgment and thought content. RESPIRATORY: Effort normal, no problems with respiration noted ABDOMEN: No masses noted. No other overt distention noted.   PELVIC: Normal appearing external genitalia; normal appearing vaginal mucosa and cervix.  No abnormal discharge noted.    Labs and Imaging No results found for this or any previous visit (from the past 168 hour(s)). No results found.    Assessment and Plan:   Problem List Items Addressed This Visit       Genitourinary   Abnormal uterine bleeding (AUB) - Primary    Change in bleeding pattern over the past few years. Recommend 01/11/2021 and EMB, will hold off on both until she has her insurance in place, RTC in about 2 months. Also recommended considering colonoscopy given significant anemia to make sure this is not a possible source, though much more likely to be uterine. Continue PO iron every other day. Pending results of this future workup we can discuss treatment options for her bleeding, she is open to hormonal IUD.   Due for pap, collected today (has FP medicaid).  Noted to be due for mammogram after visit, will have support staff reach out and refer her to [facility] program, or can order at  her follow up visit if this is not successful.       Other Visit Diagnoses     Screening for cervical cancer       Relevant Orders   Cytology - PAP( [place])   Encounter for screening mammogram for malignant neoplasm of breast           Routine preventative health maintenance measures emphasized. Please refer to After Visit Summary for other counseling recommendations.   Return in about 2 months (around 01/17/2021) for endometrial biopsy.     Total face-to-face time with patient: 25 minutes.  Over 50% of encounter was spent on counseling and coordination of care.   Venora Maples, MD/MPH Attending Family Medicine Physician, [facility] for [facility], [facility]

## 2020-11-18 DIAGNOSIS — N939 Abnormal uterine and vaginal bleeding, unspecified: Secondary | ICD-10-CM | POA: Insufficient documentation

## 2020-11-18 NOTE — Assessment & Plan Note (Addendum)
Change in bleeding pattern over the past few years. Recommend [place] and EMB, will hold off on both until she has her insurance in place, RTC in about 2 months. Also recommended considering colonoscopy given significant anemia to make sure this is not a possible source, though much more likely to be uterine. Continue PO iron every other day. Pending results of this future workup we can discuss treatment options for her bleeding, she is open to hormonal IUD.   Due for pap, collected today (has [place] medicaid).  Noted to be due for mammogram after visit, will have support staff reach out and refer her to [facility] program, or can order at her follow up visit if this is not successful.

## 2020-11-19 LAB — CYTOLOGY - PAP
Comment: NEGATIVE
Diagnosis: NEGATIVE
High risk HPV: NEGATIVE

## 2021-08-02 DIAGNOSIS — Z419 Encounter for procedure for purposes other than remedying health state, unspecified: Secondary | ICD-10-CM | POA: Diagnosis not present

## 2021-09-02 DIAGNOSIS — Z419 Encounter for procedure for purposes other than remedying health state, unspecified: Secondary | ICD-10-CM | POA: Diagnosis not present

## 2021-10-02 DIAGNOSIS — Z419 Encounter for procedure for purposes other than remedying health state, unspecified: Secondary | ICD-10-CM | POA: Diagnosis not present

## 2021-10-30 ENCOUNTER — Emergency Department (HOSPITAL_COMMUNITY)
Admission: EM | Admit: 2021-10-30 | Discharge: 2021-10-30 | Discharge status: Against Medical Advice | Payer: Medicaid Other | Attending: Emergency Medicine | Admitting: Emergency Medicine

## 2021-10-30 ENCOUNTER — Encounter (HOSPITAL_COMMUNITY): Payer: Self-pay | Admitting: Emergency Medicine

## 2021-10-30 DIAGNOSIS — R21 Rash and other nonspecific skin eruption: Secondary | ICD-10-CM | POA: Insufficient documentation

## 2021-10-30 DIAGNOSIS — D509 Iron deficiency anemia, unspecified: Secondary | ICD-10-CM

## 2021-10-30 DIAGNOSIS — R519 Headache, unspecified: Secondary | ICD-10-CM | POA: Insufficient documentation

## 2021-10-30 LAB — CBC WITH DIFFERENTIAL/PLATELET
Abs Immature Granulocytes: 0.04 10*3/uL (ref 0.00–0.07)
Basophils Absolute: 0 10*3/uL (ref 0.0–0.1)
Basophils Relative: 0 %
Eosinophils Absolute: 0.1 10*3/uL (ref 0.0–0.5)
Eosinophils Relative: 1 %
HCT: 25.4 % — ABNORMAL LOW (ref 36.0–46.0)
Hemoglobin: 6.2 g/dL — CL (ref 12.0–15.0)
Immature Granulocytes: 1 %
Lymphocytes Relative: 28 %
Lymphs Abs: 2.3 10*3/uL (ref 0.7–4.0)
MCH: 12.8 pg — ABNORMAL LOW (ref 26.0–34.0)
MCHC: 24.4 g/dL — ABNORMAL LOW (ref 30.0–36.0)
MCV: 52.6 fL — ABNORMAL LOW (ref 80.0–100.0)
Monocytes Absolute: 1.1 10*3/uL — ABNORMAL HIGH (ref 0.1–1.0)
Monocytes Relative: 13 %
Neutro Abs: 4.6 10*3/uL (ref 1.7–7.7)
Neutrophils Relative %: 57 %
Platelets: 346 10*3/uL (ref 150–400)
RBC: 4.83 MIL/uL (ref 3.87–5.11)
RDW: 25.9 % — ABNORMAL HIGH (ref 11.5–15.5)
WBC: 8.1 10*3/uL (ref 4.0–10.5)
nRBC: 0 % (ref 0.0–0.2)

## 2021-10-30 LAB — COMPREHENSIVE METABOLIC PANEL
ALT: 11 U/L (ref 0–44)
AST: 14 U/L — ABNORMAL LOW (ref 15–41)
Albumin: 3.6 g/dL (ref 3.5–5.0)
Alkaline Phosphatase: 36 U/L — ABNORMAL LOW (ref 38–126)
Anion gap: 9 (ref 5–15)
BUN: 6 mg/dL (ref 6–20)
CO2: 25 mmol/L (ref 22–32)
Calcium: 8.8 mg/dL — ABNORMAL LOW (ref 8.9–10.3)
Chloride: 103 mmol/L (ref 98–111)
Creatinine, Ser: 0.33 mg/dL — ABNORMAL LOW (ref 0.44–1.00)
GFR, Estimated: >60 mL/min (ref >60)
Glucose, Bld: 127 mg/dL — ABNORMAL HIGH (ref 70–99)
Potassium: 3.4 mmol/L — ABNORMAL LOW (ref 3.5–5.1)
Sodium: 137 mmol/L (ref 135–145)
Total Bilirubin: 0.2 mg/dL — ABNORMAL LOW (ref 0.3–1.2)
Total Protein: 8.2 g/dL — ABNORMAL HIGH (ref 6.5–8.1)

## 2021-10-30 LAB — I-STAT BETA HCG BLOOD, ED (MC, WL, AP ONLY): I-stat hCG, quantitative: <5 m[IU]/mL (ref <5)

## 2021-10-30 LAB — PREPARE RBC (CROSSMATCH)

## 2021-10-30 MED ORDER — PROCHLORPERAZINE EDISYLATE 10 MG/2ML IJ SOLN
10.0000 mg | Freq: Once | INTRAMUSCULAR | Status: AC
  Administered 2021-10-30: 10 mg via INTRAVENOUS
  Filled 2021-10-30: qty 2

## 2021-10-30 MED ORDER — S.S.S. TONIC PO LIQD: 45.0000 mL | Freq: Every day | ORAL | 0 refills | Status: AC

## 2021-10-30 MED ORDER — SODIUM CHLORIDE 0.9 % IV BOLUS
1000.0000 mL | Freq: Once | INTRAVENOUS | Status: AC
  Administered 2021-10-30: 1000 mL via INTRAVENOUS

## 2021-10-30 MED ORDER — SODIUM CHLORIDE 0.9 % IV SOLN
10.0000 mL/h | Freq: Once | INTRAVENOUS | Status: AC
  Administered 2021-10-30: 10 mL/h via INTRAVENOUS

## 2021-10-30 NOTE — Discharge Instructions (Addendum)
Your hemoglobin was 6.2 today, which is dangerously low.  We strongly recommended to give you a blood transfusion to improve your hemoglobin level.  You did not want to wait for the blood transfusion in the ER, and choose to leave against medical advice.  I would encourage you to return to the hospital as soon as possible for blood transfusion.  Low hemoglobin can lead to difficulty breathing, lightheadedness, loss of consciousness, organ failure and death.  I also strongly recommend that you begin taking iron as prescribed daily to help your body rebuild more blood.  You can use over-the-counter stool softeners as needed for constipation.  Please reach out to your doctors office to have your blood counts rechecked in the follow-up on this visit.

## 2021-10-30 NOTE — ED Provider Notes (Signed)
41 yo female w/ lupus, iron deficiency anemia, not taking iron at home   Physical Exam  BP 122/72   Pulse 89   Temp 98.5 F (36.9 C) (Oral)   Resp 19   Ht 4\' 9"  (1.448 m)   Wt 95.3 kg   SpO2 95%   BMI 45.44 kg/m   Physical Exam  Procedures  Procedures  ED Course / MDM    Medical Decision Making Amount and/or Complexity of Data Reviewed Labs: ordered.  Risk OTC drugs. Prescription drug management.   Pending 1 unit pRBC (pt wanting only 1 unit), and discharge  *  Patient is not wanting to stay for blood transfusion, citing concerns getting to work.  She understands that her blood counts are dangerously low, which can lead to organ failure and death, and she was recommended to return as soon as possible for blood transfusion, to resume her iron and also to follow-up with her doctor.       , MD 10/30/21 [phone]

## 2021-10-30 NOTE — ED Notes (Signed)
Pt wanting to leave MD aware, and pt provided information regarding their condition and the risks of leaving. Pt understands. Pt leaves AMA.

## 2021-10-30 NOTE — ED Triage Notes (Signed)
Pt reports a hx of lupus and states that she has had a migraine for about 2 weeks. Also reports facial swelling. Photosensitive.

## 2021-10-30 NOTE — ED Provider Notes (Signed)
[facility]-EMERGENCY DEPT Provider Note  CSN: [id number] Arrival date & time: 10/30/21 0304  Chief Complaint(s) Headache  HPI [name] is a 41 y.o. female with a past medical history listed below including lupus who presents to the emergency department with several weeks of migrainous type headache.  It is left-sided, moderate to severe in nature with associated photophobia.  No nausea or vomiting.  Pain is improved with high doses of Aleve which she has been taking over the past 3 weeks.  She reports similar episodes in the past which began 1 year ago.  At that time she was diagnosed with anemia and required blood transfusions.  She was supposed to be taking iron supplements but has not been doing so.  She has noticed that her headaches are worse during menstrual cycles.  She also notices generalized fatigue at that time.  She denies any recent fevers or infections.  No coughing or congestion.  No nausea or vomiting.  No abdominal pain.  No chest pain.  She endorses dark stools but no melena.  The history is provided by the patient.    Past Medical History Past Medical History:  Diagnosis Date   Lupus [facility])    Patient Active Problem List   Diagnosis Date Noted   Abnormal uterine bleeding (AUB) 11/18/2020   Home Medication(s) Prior to Admission medications   Medication Sig Start Date End Date Taking? Authorizing Provider  Iron-Vitamins (S.S.S. TONIC) LIQD Take 45 mLs by mouth daily. 10/30/21  Yes Danford Tat, Amadeo Garnet, MD  acetaminophen (TYLENOL) 500 MG tablet Take 1,000 mg by mouth every 6 (six) hours as needed for mild pain or headache.    [provider]  ferrous sulfate 325 (65 FE) MG tablet Take 1 tablet (325 mg total) by mouth daily. 10/31/20 11/30/20  Terald Sleeper, MD  ibuprofen (ADVIL) 200 MG tablet Take 400 mg by mouth every 6 (six) hours as needed for headache or mild pain.    [provider]  naproxen sodium (ALEVE) 220 MG  tablet Take 660 mg by mouth 2 (two) times daily as needed (pain).    [provider]                                                                                                                                    Allergies Patient has no known allergies.  Review of Systems Review of Systems As noted in HPI  Physical Exam Vital Signs  I have reviewed the triage vital signs BP 113/68   Pulse 82   Temp 98.5 F (36.9 C) (Oral)   Resp 20   Ht 4\' 9"  (1.448 m)   Wt 95.3 kg   SpO2 98%   BMI 45.44 kg/m   Physical Exam Vitals reviewed.  Constitutional:      General: She is not in acute distress.    Appearance: She is well-developed. She is not  diaphoretic.  HENT:     Head: Normocephalic and atraumatic.     Comments: Malar rash    Right Ear: External ear normal.     Left Ear: External ear normal.     Nose: Nose normal.  Eyes:     General: No scleral icterus.    Conjunctiva/sclera: Conjunctivae normal.  Neck:     Trachea: Phonation normal.  Cardiovascular:     Rate and Rhythm: Normal rate and regular rhythm.  Pulmonary:     Effort: Pulmonary effort is normal. No respiratory distress.     Breath sounds: No stridor.  Abdominal:     General: There is no distension.  Musculoskeletal:        General: Normal range of motion.     Cervical back: Normal range of motion.  Neurological:     Mental Status: She is alert and oriented to person, place, and time.  Psychiatric:        Behavior: Behavior normal.     ED Results and Treatments Labs (all labs ordered are listed, but only abnormal results are displayed) Labs Reviewed  CBC WITH DIFFERENTIAL/PLATELET - Abnormal; Notable for the following components:      Result Value   Hemoglobin 6.2 (*)    HCT 25.4 (*)    MCV 52.6 (*)    MCH 12.8 (*)    MCHC 24.4 (*)    RDW 25.9 (*)    Monocytes Absolute 1.1 (*)    All other components within normal limits  COMPREHENSIVE METABOLIC PANEL - Abnormal; Notable for the  following components:   Potassium 3.4 (*)    Glucose, Bld 127 (*)    Creatinine, Ser 0.33 (*)    Calcium 8.8 (*)    Total Protein 8.2 (*)    AST 14 (*)    Alkaline Phosphatase 36 (*)    Total Bilirubin 0.2 (*)    All other components within normal limits  PATHOLOGIST SMEAR REVIEW  I-STAT BETA HCG BLOOD, ED ([facility], [facility], [facility] ONLY)  PREPARE RBC (CROSSMATCH)  TYPE AND SCREEN                                                                                                                         EKG  EKG Interpretation  Date/Time:    Ventricular Rate:    PR Interval:    QRS Duration:   QT Interval:    QTC Calculation:   R Axis:     Text Interpretation:         Radiology No results found.  Pertinent labs & imaging results that were available during my care of the patient were reviewed by me and considered in my medical decision making (see MDM for details).  Medications Ordered in ED Medications  sodium chloride 0.9 % bolus 1,000 mL (0 mLs Intravenous Stopped 10/30/21 0611)  prochlorperazine (COMPAZINE) injection 10 mg (10 mg Intravenous Given 10/30/21 0405)  0.9 %  sodium chloride infusion (10 mL/hr Intravenous New Bag/Given  10/30/21 3154)                                                                                                                                     Procedures .Critical Care  Performed by: Nira Conn, MD Authorized by: Nira Conn, MD   Critical care provider statement:    Critical care time (minutes):  45   Critical care time was exclusive of:  Separately billable procedures and treating other patients   Critical care was necessary to treat or prevent imminent or life-threatening deterioration of the following conditions:  Circulatory failure   Critical care was time spent personally by me on the following activities:  Development of treatment plan with patient or surrogate, discussions with consultants, evaluation of patient's response  to treatment, examination of patient, obtaining history from patient or surrogate, review of old charts, re-evaluation of patient's condition, pulse oximetry, ordering and review of radiographic studies, ordering and review of laboratory studies and ordering and performing treatments and interventions   (including critical care time)  Medical Decision Making / ED Course    Complexity of Problem:  Co-morbidities/SDOH that complicate the patient evaluation/care: Noted above in HPI  Patient's presenting problem/concern, DDX, and MDM listed below: Headache Typical migraine for patient No focal deficits on exam concerning for CVA, ICH or lupus cerebritis. She is afebrile with no nuchal rigidity -doubt meningitis. We will provide patient with migraine cocktail and reassess. Frequent use of high-dose NSAIDs With a history of anemia, will obtain labs to assess for any evidence of anemia or renal insufficiency related to either her lupus or high-dose NSAID use.  Hospitalization Considered:  Yes  Initial Intervention:  Compazine and IV fluids    Complexity of Data:   Laboratory Tests ordered listed below with my independent interpretation: CBC without leukocytosis.  She does have microcytic anemia consistent with iron deficiency anemia.  Hemoglobin of 6.2. Normal bilirubin -not consistent with hemolytic anemia. BUN normal -unlikely upper GI bleed from NSAID use. No significant electrolyte derangements or renal sufficiency. Beta-hCG negative      ED Course:    Assessment, Add'l Intervention, and Reassessment: Migraine Significantly improved with Compazine  Iron deficiency anemia Symptomatic Patient agreed to receiving 1 unit of blood emergency department. If patient remains stable after worse she should be appropriate for discharge. Recommend that she go back on her iron supplements, establish care with PCP and rheumatologist for management of her lupus.  Patient care turned  over to oncoming provider. Patient case and results discussed in detail; please see their note for further ED managment.     Final Clinical Impression(s) / ED Diagnoses Final diagnoses:  Bad headache  Iron deficiency anemia, unspecified iron deficiency anemia type           This chart was dictated using voice recognition software.  Despite best efforts to proofread,  errors can occur which can change the documentation  meaning.    Nira Conn, MD 10/30/21 [phone]

## 2021-11-01 LAB — PATHOLOGIST SMEAR REVIEW

## 2021-11-02 DIAGNOSIS — Z419 Encounter for procedure for purposes other than remedying health state, unspecified: Secondary | ICD-10-CM | POA: Diagnosis not present

## 2021-11-03 LAB — BPAM RBC
Blood Product Expiration Date: 202308142359
Unit Type and Rh: 6200

## 2021-11-03 LAB — TYPE AND SCREEN
ABO/RH(D): AB POS
Antibody Screen: NEGATIVE
Donor AG Type: NEGATIVE
Unit division: 0

## 2021-12-03 DIAGNOSIS — Z419 Encounter for procedure for purposes other than remedying health state, unspecified: Secondary | ICD-10-CM | POA: Diagnosis not present

## 2022-01-02 DIAGNOSIS — Z419 Encounter for procedure for purposes other than remedying health state, unspecified: Secondary | ICD-10-CM | POA: Diagnosis not present

## 2022-02-02 DIAGNOSIS — Z419 Encounter for procedure for purposes other than remedying health state, unspecified: Secondary | ICD-10-CM | POA: Diagnosis not present

## 2022-03-04 DIAGNOSIS — Z419 Encounter for procedure for purposes other than remedying health state, unspecified: Secondary | ICD-10-CM | POA: Diagnosis not present

## 2022-04-04 DIAGNOSIS — Z419 Encounter for procedure for purposes other than remedying health state, unspecified: Secondary | ICD-10-CM | POA: Diagnosis not present

## 2022-05-05 DIAGNOSIS — Z419 Encounter for procedure for purposes other than remedying health state, unspecified: Secondary | ICD-10-CM | POA: Diagnosis not present

## 2022-06-03 DIAGNOSIS — Z419 Encounter for procedure for purposes other than remedying health state, unspecified: Secondary | ICD-10-CM | POA: Diagnosis not present

## 2022-07-04 DIAGNOSIS — Z419 Encounter for procedure for purposes other than remedying health state, unspecified: Secondary | ICD-10-CM | POA: Diagnosis not present

## 2022-08-03 DIAGNOSIS — Z419 Encounter for procedure for purposes other than remedying health state, unspecified: Secondary | ICD-10-CM | POA: Diagnosis not present

## 2022-09-03 DIAGNOSIS — Z419 Encounter for procedure for purposes other than remedying health state, unspecified: Secondary | ICD-10-CM | POA: Diagnosis not present

## 2022-10-03 DIAGNOSIS — Z419 Encounter for procedure for purposes other than remedying health state, unspecified: Secondary | ICD-10-CM | POA: Diagnosis not present

## 2022-11-03 DIAGNOSIS — Z419 Encounter for procedure for purposes other than remedying health state, unspecified: Secondary | ICD-10-CM | POA: Diagnosis not present

## 2022-12-04 DIAGNOSIS — Z419 Encounter for procedure for purposes other than remedying health state, unspecified: Secondary | ICD-10-CM | POA: Diagnosis not present

## 2023-01-03 DIAGNOSIS — Z419 Encounter for procedure for purposes other than remedying health state, unspecified: Secondary | ICD-10-CM | POA: Diagnosis not present

## 2023-02-03 DIAGNOSIS — Z419 Encounter for procedure for purposes other than remedying health state, unspecified: Secondary | ICD-10-CM | POA: Diagnosis not present

## 2023-03-05 DIAGNOSIS — Z419 Encounter for procedure for purposes other than remedying health state, unspecified: Secondary | ICD-10-CM | POA: Diagnosis not present

## 2023-04-05 DIAGNOSIS — Z419 Encounter for procedure for purposes other than remedying health state, unspecified: Secondary | ICD-10-CM | POA: Diagnosis not present

## 2023-05-06 DIAGNOSIS — Z419 Encounter for procedure for purposes other than remedying health state, unspecified: Secondary | ICD-10-CM | POA: Diagnosis not present

## 2023-06-03 DIAGNOSIS — Z419 Encounter for procedure for purposes other than remedying health state, unspecified: Secondary | ICD-10-CM | POA: Diagnosis not present

## 2023-07-15 DIAGNOSIS — Z419 Encounter for procedure for purposes other than remedying health state, unspecified: Secondary | ICD-10-CM | POA: Diagnosis not present

## 2023-08-14 DIAGNOSIS — Z419 Encounter for procedure for purposes other than remedying health state, unspecified: Secondary | ICD-10-CM | POA: Diagnosis not present

## 2023-09-14 DIAGNOSIS — Z419 Encounter for procedure for purposes other than remedying health state, unspecified: Secondary | ICD-10-CM | POA: Diagnosis not present

## 2023-10-14 DIAGNOSIS — Z419 Encounter for procedure for purposes other than remedying health state, unspecified: Secondary | ICD-10-CM | POA: Diagnosis not present

## 2023-11-14 DIAGNOSIS — Z419 Encounter for procedure for purposes other than remedying health state, unspecified: Secondary | ICD-10-CM | POA: Diagnosis not present

## 2023-12-15 DIAGNOSIS — Z419 Encounter for procedure for purposes other than remedying health state, unspecified: Secondary | ICD-10-CM | POA: Diagnosis not present

## 2024-04-25 ENCOUNTER — Other Ambulatory Visit (HOSPITAL_BASED_OUTPATIENT_CLINIC_OR_DEPARTMENT_OTHER): Payer: Self-pay

## 2024-04-25 ENCOUNTER — Telehealth: Admitting: Physician Assistant

## 2024-04-25 DIAGNOSIS — M329 Systemic lupus erythematosus, unspecified: Secondary | ICD-10-CM | POA: Diagnosis not present

## 2024-04-25 MED ORDER — PREDNISONE 10 MG PO TABS
ORAL_TABLET | ORAL | 0 refills | Status: AC
  Filled 2024-04-25: qty 18, 9d supply, fill #0

## 2024-04-25 MED ORDER — TRIAMCINOLONE ACETONIDE 0.025 % EX OINT
1.0000 | TOPICAL_OINTMENT | Freq: Two times a day (BID) | CUTANEOUS | 0 refills | Status: AC
  Filled 2024-04-25: qty 15, 30d supply, fill #0

## 2024-04-25 NOTE — Patient Instructions (Signed)
 " [name], thank you for joining Angela Havrilla, [name] for today's virtual visit.  While this provider is not your primary care provider (PCP), if your PCP is located in our provider database this encounter information will be shared with them immediately following your visit.   A [facility] MyChart account gives you access to today's visit and all your visits, tests, and labs performed at [facility]  click here if you don't have a [place] MyChart account or go to [email or web address]  Consent: (Patient) [name] provided verbal consent for this virtual visit at the beginning of the encounter.  Current Medications:  Current Outpatient Medications:    predniSONE  (DELTASONE ) 10 MG tablet, Take 1 tablet (10 mg total) by mouth with breakfast, with lunch, and with evening meal for 3 days, THEN 1 tablet (10 mg total) 2 (two) times daily with a meal for 3 days, THEN 1 tablet (10 mg total) daily with breakfast for 3 days., Disp: 18 tablet, Rfl: 0   triamcinolone  (KENALOG ) 0.025 % ointment, Apply 1 Application topically 2 (two) times daily for 10 days., Disp: 30 g, Rfl: 0   acetaminophen  (TYLENOL ) 500 MG tablet, Take 1,000 mg by mouth every 6 (six) hours as needed for mild pain or headache., Disp: , Rfl:    ferrous sulfate  325 (65 FE) MG tablet, Take 1 tablet (325 mg total) by mouth daily., Disp: 30 tablet, Rfl: 3   ibuprofen (ADVIL) 200 MG tablet, Take 400 mg by mouth every 6 (six) hours as needed for headache or mild pain., Disp: , Rfl:    Iron-Vitamins (S.S.S. TONIC) LIQD, Take 45 mLs by mouth daily., Disp: 600 mL, Rfl: 0   naproxen sodium (ALEVE) 220 MG tablet, Take 660 mg by mouth 2 (two) times daily as needed (pain)., Disp: , Rfl:    Medications ordered in this encounter:  Meds ordered this encounter  Medications   triamcinolone  (KENALOG ) 0.025 % ointment    Sig: Apply 1 Application topically 2 (two) times daily for 10 days.    Dispense:  30 g    Refill:   0    Supervising Provider:   BLAISE ALEENE KIDD [[id number]]   predniSONE  (DELTASONE ) 10 MG tablet    Sig: Take 1 tablet (10 mg total) by mouth with breakfast, with lunch, and with evening meal for 3 days, THEN 1 tablet (10 mg total) 2 (two) times daily with a meal for 3 days, THEN 1 tablet (10 mg total) daily with breakfast for 3 days.    Dispense:  18 tablet    Refill:  0    Supervising Provider:   BLAISE ALEENE KIDD [[id number]]     *If you need refills on other medications prior to your next appointment, please contact your pharmacy*  Follow-Up: Call back or seek an in-person evaluation if the symptoms worsen or if the condition fails to improve as anticipated.  [facility] [phone]  Other Instructions Please make sure to take the medication with food as directed Follow-up with primary care in 5 to 7 days   If you have been instructed to have an in-person evaluation today at a local Urgent Care facility, please use the link below. It will take you to a list of all of our available [facility], including address, phone number and hours of operation. Please do not delay care.  [facility]  If you or a family member do not have a primary care  provider, use the link below to schedule a visit and establish care. When you choose a [facility] primary care physician or advanced practice provider, you gain a long-term partner in health. Find a Primary Care Provider  Learn more about [facility] in-office and virtual care options:  - Get Care Now  "

## 2024-04-25 NOTE — Progress Notes (Signed)
 " Virtual Visit Consent   [name], you are scheduled for a virtual visit with a [facility] provider today. Just as with appointments in the office, your consent must be obtained to participate. Your consent will be active for this visit and any virtual visit you may have with one of our providers in the next 365 days. If you have a MyChart account, a copy of this consent can be sent to you electronically.  As this is a virtual visit, video technology does not allow for your provider to perform a traditional examination. This may limit your provider's ability to fully assess your condition. If your provider identifies any concerns that need to be evaluated in person or the need to arrange testing (such as labs, EKG, etc.), we will make arrangements to do so. Although advances in technology are sophisticated, we cannot ensure that it will always work on either your end or our end. If the connection with a video visit is poor, the visit may have to be switched to a telephone visit. With either a video or telephone visit, we are not always able to ensure that we have a secure connection.  By engaging in this virtual visit, you consent to the provision of healthcare and authorize for your insurance to be billed (if applicable) for the services provided during this visit. Depending on your insurance coverage, you may receive a charge related to this service.  I need to obtain your verbal consent now. Are you willing to proceed with your visit today? ABRI VACCA has provided verbal consent on 04/25/2024 for a virtual visit (video or telephone). [name], [place]  Date: 04/25/2024 8:56 AM   Virtual Visit via Video Note   I, [name], connected with  AMAMDA CURBOW  ([id number], [birth year]) on 04/25/24 at  8:30 AM EST by a video-enabled telemedicine application and verified that I am speaking with the correct person using two identifiers.  Location: Patient: Virtual Visit Location  Patient: Home Provider: Virtual Visit Location Provider: Home Office   I discussed the limitations of evaluation and management by telemedicine and the availability of in person appointments. The patient expressed understanding and agreed to proceed.    History of Present Illness: [name] is a 44 y.o. who identifies as a female who was assigned female at birth, and is being seen today for lupus rash.  HPI: Past medical history of lupus who presents today for evaluation of a possible flare for the last couple of days.  Patient reports increased fatigue, joint pains and worsening of rash on her face.  Patient reports she has chronic scarring from the rash since her initial diagnosis, has not been treated for this.  She denies any fevers, chills, nausea, vomiting, significant joint swelling, chest pain, shortness of breath.  Patient reports the last time she was on steroids was many years ago.    Problems:  Patient Active Problem List   Diagnosis Date Noted   Abnormal uterine bleeding (AUB) 11/18/2020   Lupus (systemic lupus erythematosus) (HCC) 03/26/1998    Allergies: Allergies[1] Medications: Current Medications[2]  Observations/Objective: Patient is well-developed, well-nourished in no acute distress.  Resting comfortably  at home.  Head is normocephalic, atraumatic.  No labored breathing. Speech is clear and coherent with logical content.  Patient is alert and oriented at baseline.  Skin: Right side of the face with hyperpigmentation, scarring, textured skin, spreading of rash over the nose and to the left side of the face  with mild erythema, dry scaly   Assessment and Plan: 1. Systemic lupus erythematosus, unspecified SLE type, unspecified organ involvement status (HCC) (Primary) - triamcinolone  (KENALOG ) 0.025 % ointment; Apply 1 Application topically 2 (two) times daily for 10 days.  Dispense: 30 g; Refill: 0 - predniSONE  (DELTASONE ) 10 MG tablet; Take 1 tablet (10 mg  total) by mouth with breakfast, with lunch, and with evening meal for 3 days, THEN 1 tablet (10 mg total) 2 (two) times daily with a meal for 3 days, THEN 1 tablet (10 mg total) daily with breakfast for 3 days.  Dispense: 18 tablet; Refill: 0   No acute alarming symptoms reported at this time, patient was advised to follow-up with primary care and dermatology with possible referral to rheumatology.  Patient has not had treatment for her lupus rash since her initial diagnosis.  Will start with triamcinolone  topically for the next 7 days with oral prednisone  taper.  Patient to follow-up with primary care in the next 5 to 7 days, sooner if any change or worsening symptoms. Follow Up Instructions: I discussed the assessment and treatment plan with the patient. The patient was provided an opportunity to ask questions and all were answered. The patient agreed with the plan and demonstrated an understanding of the instructions.  A copy of instructions were sent to the patient via MyChart unless otherwise noted below.     The patient was advised to call back or seek an in-person evaluation if the symptoms worsen or if the condition fails to improve as anticipated.    Zennie Ayars, PA-C     [1] No Known Allergies [2]  Current Outpatient Medications:    predniSONE  (DELTASONE ) 10 MG tablet, Take 1 tablet (10 mg total) by mouth with breakfast, with lunch, and with evening meal for 3 days, THEN 1 tablet (10 mg total) 2 (two) times daily with a meal for 3 days, THEN 1 tablet (10 mg total) daily with breakfast for 3 days., Disp: 18 tablet, Rfl: 0   triamcinolone  (KENALOG ) 0.025 % ointment, Apply 1 Application topically 2 (two) times daily for 10 days., Disp: 30 g, Rfl: 0   acetaminophen  (TYLENOL ) 500 MG tablet, Take 1,000 mg by mouth every 6 (six) hours as needed for mild pain or headache., Disp: , Rfl:    ferrous sulfate  325 (65 FE) MG tablet, Take 1 tablet (325 mg total) by mouth daily., Disp: 30 tablet,  Rfl: 3   ibuprofen (ADVIL) 200 MG tablet, Take 400 mg by mouth every 6 (six) hours as needed for headache or mild pain., Disp: , Rfl:    Iron-Vitamins (S.S.S. TONIC) LIQD, Take 45 mLs by mouth daily., Disp: 600 mL, Rfl: 0   naproxen sodium (ALEVE) 220 MG tablet, Take 660 mg by mouth 2 (two) times daily as needed (pain)., Disp: , Rfl:   "

## 2024-05-07 ENCOUNTER — Telehealth: Admitting: Physician Assistant

## 2024-05-07 ENCOUNTER — Other Ambulatory Visit (HOSPITAL_BASED_OUTPATIENT_CLINIC_OR_DEPARTMENT_OTHER): Payer: Self-pay

## 2024-05-07 DIAGNOSIS — K219 Gastro-esophageal reflux disease without esophagitis: Secondary | ICD-10-CM | POA: Diagnosis not present

## 2024-05-07 DIAGNOSIS — M329 Systemic lupus erythematosus, unspecified: Secondary | ICD-10-CM

## 2024-05-07 DIAGNOSIS — M778 Other enthesopathies, not elsewhere classified: Secondary | ICD-10-CM

## 2024-05-07 MED ORDER — FAMOTIDINE 20 MG PO TABS
20.0000 mg | ORAL_TABLET | Freq: Two times a day (BID) | ORAL | 0 refills | Status: AC
  Filled 2024-05-07: qty 50, 25d supply, fill #0
  Filled 2024-05-07: qty 10, 5d supply, fill #0

## 2024-05-07 MED ORDER — PREDNISONE 10 MG PO TABS
10.0000 mg | ORAL_TABLET | Freq: Every day | ORAL | 0 refills | Status: AC
  Filled 2024-05-07: qty 14, 14d supply, fill #0

## 2024-05-07 NOTE — Patient Instructions (Signed)
" °  Annabella JENEANE Batter, thank you for joining Delon CHRISTELLA Dickinson, PA-C for today's virtual visit.  While this provider is not your primary care provider (PCP), if your PCP is located in our provider database this encounter information will be shared with them immediately following your visit.   A [facility] MyChart account gives you access to today's visit and all your visits, tests, and labs performed at [facility]  click here if you don't have a [facility] MyChart account or go to [email or web address]  Consent: (Patient) Keysha CHARLEE SQUIBB provided verbal consent for this virtual visit at the beginning of the encounter.  Current Medications:  Current Outpatient Medications:    famotidine  (PEPCID ) 20 MG tablet, Take 1 tablet (20 mg total) by mouth 2 (two) times daily., Disp: 60 tablet, Rfl: 0   predniSONE  (DELTASONE ) 10 MG tablet, Take 1 tablet (10 mg total) by mouth daily with breakfast., Disp: 14 tablet, Rfl: 0   acetaminophen  (TYLENOL ) 500 MG tablet, Take 1,000 mg by mouth every 6 (six) hours as needed for mild pain or headache., Disp: , Rfl:    ferrous sulfate  325 (65 FE) MG tablet, Take 1 tablet (325 mg total) by mouth daily., Disp: 30 tablet, Rfl: 3   ibuprofen (ADVIL) 200 MG tablet, Take 400 mg by mouth every 6 (six) hours as needed for headache or mild pain., Disp: , Rfl:    Iron-Vitamins (S.S.S. TONIC) LIQD, Take 45 mLs by mouth daily., Disp: 600 mL, Rfl: 0   naproxen sodium (ALEVE) 220 MG tablet, Take 660 mg by mouth 2 (two) times daily as needed (pain)., Disp: , Rfl:    triamcinolone  (KENALOG ) 0.025 % ointment, Apply 1 Application topically 2 (two) times daily for 10 days., Disp: 30 g, Rfl: 0   Medications ordered in this encounter:  Meds ordered this encounter  Medications   predniSONE  (DELTASONE ) 10 MG tablet    Sig: Take 1 tablet (10 mg total) by mouth daily with breakfast.    Dispense:  14 tablet    Refill:  0    Supervising Provider:   BLAISE ALEENE KIDD  [[id number]]   famotidine  (PEPCID ) 20 MG tablet    Sig: Take 1 tablet (20 mg total) by mouth 2 (two) times daily.    Dispense:  60 tablet    Refill:  0    Supervising Provider:   BLAISE ALEENE KIDD [[id number]]     *If you need refills on other medications prior to your next appointment, please contact your pharmacy*  Follow-Up: Call back or seek an in-person evaluation if the symptoms worsen or if the condition fails to improve as anticipated.   If you have been instructed to have an in-person evaluation today at a local Urgent Care facility, please use the link below. It will take you to a list of all of our available [facility], including address, phone number and hours of operation. Please do not delay care.  [facility]  If you or a family member do not have a primary care provider, use the link below to schedule a visit and establish care. When you choose a [place] primary care physician or advanced practice provider, you gain a long-term partner in health. Find a Primary Care Provider  Learn more about [facility] in-office and virtual care options: [facility] - Get Care Now "
